# Patient Record
Sex: Female | Born: 1965 | Race: Asian | Hispanic: No | Marital: Married | Smoking: Never smoker
Health system: Southern US, Community
[De-identification: ages and names within clinical notes are randomized; demographics above are authoritative.]

## PROBLEM LIST (undated history)

## (undated) DIAGNOSIS — K219 Gastro-esophageal reflux disease without esophagitis: Secondary | ICD-10-CM

## (undated) DIAGNOSIS — N83209 Unspecified ovarian cyst, unspecified side: Secondary | ICD-10-CM

## (undated) HISTORY — DX: Gastro-esophageal reflux disease without esophagitis: K21.9

## (undated) HISTORY — PX: OTHER SURGICAL HISTORY: SHX169

---

## 1997-11-24 ENCOUNTER — Encounter: Admission: RE | Admit: 1997-11-24 | Discharge: 1997-11-24 | Payer: Self-pay | Admitting: Family Medicine

## 1997-12-01 ENCOUNTER — Encounter: Admission: RE | Admit: 1997-12-01 | Discharge: 1997-12-01 | Payer: Self-pay | Admitting: Family Medicine

## 1997-12-01 ENCOUNTER — Other Ambulatory Visit: Admission: RE | Admit: 1997-12-01 | Discharge: 1997-12-01 | Payer: Self-pay

## 1997-12-29 ENCOUNTER — Encounter: Admission: RE | Admit: 1997-12-29 | Discharge: 1997-12-29 | Payer: Self-pay | Admitting: Family Medicine

## 1998-02-01 ENCOUNTER — Encounter: Admission: RE | Admit: 1998-02-01 | Discharge: 1998-02-01 | Payer: Self-pay | Admitting: Family Medicine

## 1998-02-26 ENCOUNTER — Encounter: Payer: Self-pay | Admitting: *Deleted

## 1998-02-26 ENCOUNTER — Ambulatory Visit (HOSPITAL_COMMUNITY): Admission: RE | Admit: 1998-02-26 | Discharge: 1998-02-26 | Payer: Self-pay

## 1998-03-02 ENCOUNTER — Encounter: Admission: RE | Admit: 1998-03-02 | Discharge: 1998-03-02 | Payer: Self-pay | Admitting: Family Medicine

## 1998-04-05 ENCOUNTER — Encounter: Admission: RE | Admit: 1998-04-05 | Discharge: 1998-04-05 | Payer: Self-pay | Admitting: Sports Medicine

## 1998-04-27 ENCOUNTER — Encounter: Admission: RE | Admit: 1998-04-27 | Discharge: 1998-04-27 | Payer: Self-pay | Admitting: Sports Medicine

## 1998-04-30 ENCOUNTER — Ambulatory Visit (HOSPITAL_COMMUNITY): Admission: RE | Admit: 1998-04-30 | Discharge: 1998-04-30 | Payer: Self-pay | Admitting: *Deleted

## 1998-05-14 ENCOUNTER — Encounter: Admission: RE | Admit: 1998-05-14 | Discharge: 1998-05-14 | Payer: Self-pay | Admitting: Family Medicine

## 1998-05-31 ENCOUNTER — Encounter: Admission: RE | Admit: 1998-05-31 | Discharge: 1998-05-31 | Payer: Self-pay | Admitting: Family Medicine

## 1998-06-14 ENCOUNTER — Encounter: Admission: RE | Admit: 1998-06-14 | Discharge: 1998-06-14 | Payer: Self-pay | Admitting: Family Medicine

## 1998-06-23 ENCOUNTER — Encounter: Admission: RE | Admit: 1998-06-23 | Discharge: 1998-06-23 | Payer: Self-pay | Admitting: Family Medicine

## 1998-07-03 ENCOUNTER — Inpatient Hospital Stay (HOSPITAL_COMMUNITY): Admission: AD | Admit: 1998-07-03 | Discharge: 1998-07-05 | Payer: Self-pay | Admitting: Family Medicine

## 1998-07-13 ENCOUNTER — Encounter: Admission: RE | Admit: 1998-07-13 | Discharge: 1998-07-13 | Payer: Self-pay | Admitting: Sports Medicine

## 1998-07-20 ENCOUNTER — Encounter: Admission: RE | Admit: 1998-07-20 | Discharge: 1998-07-20 | Payer: Self-pay | Admitting: Family Medicine

## 1998-07-26 ENCOUNTER — Encounter: Admission: RE | Admit: 1998-07-26 | Discharge: 1998-07-26 | Payer: Self-pay | Admitting: Family Medicine

## 1998-07-28 ENCOUNTER — Encounter: Admission: RE | Admit: 1998-07-28 | Discharge: 1998-07-28 | Payer: Self-pay | Admitting: Family Medicine

## 1998-12-31 ENCOUNTER — Encounter: Admission: RE | Admit: 1998-12-31 | Discharge: 1998-12-31 | Payer: Self-pay | Admitting: Family Medicine

## 2003-05-28 ENCOUNTER — Encounter: Admission: RE | Admit: 2003-05-28 | Discharge: 2003-05-28 | Payer: Self-pay | Admitting: Internal Medicine

## 2003-07-12 ENCOUNTER — Inpatient Hospital Stay (HOSPITAL_COMMUNITY): Admission: AD | Admit: 2003-07-12 | Discharge: 2003-07-14 | Payer: Self-pay | Admitting: *Deleted

## 2005-07-14 ENCOUNTER — Emergency Department (HOSPITAL_COMMUNITY): Admission: EM | Admit: 2005-07-14 | Discharge: 2005-07-14 | Payer: Self-pay | Admitting: Family Medicine

## 2010-08-23 ENCOUNTER — Emergency Department (HOSPITAL_COMMUNITY): Payer: No Typology Code available for payment source

## 2010-08-23 ENCOUNTER — Emergency Department (HOSPITAL_COMMUNITY)
Admission: EM | Admit: 2010-08-23 | Discharge: 2010-08-23 | Disposition: A | Discharge status: Home / Self Care | Payer: No Typology Code available for payment source | Attending: Emergency Medicine | Admitting: Emergency Medicine

## 2010-08-23 DIAGNOSIS — IMO0002 Reserved for concepts with insufficient information to code with codable children: Secondary | ICD-10-CM | POA: Insufficient documentation

## 2010-08-23 DIAGNOSIS — R109 Unspecified abdominal pain: Secondary | ICD-10-CM | POA: Insufficient documentation

## 2010-08-23 DIAGNOSIS — M25559 Pain in unspecified hip: Secondary | ICD-10-CM | POA: Insufficient documentation

## 2010-08-23 DIAGNOSIS — M25519 Pain in unspecified shoulder: Secondary | ICD-10-CM | POA: Insufficient documentation

## 2010-08-23 DIAGNOSIS — T148XXA Other injury of unspecified body region, initial encounter: Secondary | ICD-10-CM | POA: Insufficient documentation

## 2010-08-23 DIAGNOSIS — M542 Cervicalgia: Secondary | ICD-10-CM | POA: Insufficient documentation

## 2010-08-23 DIAGNOSIS — R51 Headache: Secondary | ICD-10-CM | POA: Insufficient documentation

## 2011-06-08 ENCOUNTER — Emergency Department (INDEPENDENT_AMBULATORY_CARE_PROVIDER_SITE_OTHER)
Admission: EM | Admit: 2011-06-08 | Discharge: 2011-06-08 | Disposition: A | Discharge status: Home / Self Care | Payer: Self-pay | Source: Home / Self Care | Attending: Emergency Medicine | Admitting: Emergency Medicine

## 2011-06-08 ENCOUNTER — Encounter (HOSPITAL_COMMUNITY): Payer: Self-pay | Admitting: *Deleted

## 2011-06-08 DIAGNOSIS — R059 Cough, unspecified: Secondary | ICD-10-CM

## 2011-06-08 DIAGNOSIS — R05 Cough: Secondary | ICD-10-CM

## 2011-06-08 DIAGNOSIS — J069 Acute upper respiratory infection, unspecified: Secondary | ICD-10-CM

## 2011-06-08 MED ORDER — GUAIFENESIN-CODEINE 100-10 MG/5ML PO SYRP: 5.0000 mL | ORAL_SOLUTION | Freq: Three times a day (TID) | ORAL | Status: AC | PRN

## 2011-06-08 NOTE — ED Notes (Signed)
Pt  Has  Symptoms  Of  Body  Aches   Cough   Congested  And  sorethroat  X   3  Days     Pt  Is  Sitting  Upright on  Exam table  Speaking in  Complete  sentances

## 2011-06-08 NOTE — ED Provider Notes (Signed)
History     CSN: [id number]  Arrival date & time 06/08/11  0841   First MD Initiated Contact with Patient 06/08/11 [phone]      Chief Complaint  Patient presents with  . Cough     HPI Comments: Patient began having a cough, sore throat and soreness in her arms 3 days ago. Symptoms have been stable and improved with motrin. Yesterday she had a coughing episode followed by burning in the throat and accompanied with some nausea. She has also taken some tea for symptoms. Her two daughters were treated for similar symptoms accompanied by fever last week with cough medicine, and they are improving. Patient states she had a flu shot in November 2012.   Patient denies dyspnea, wheezing, chest pain, emesis, abdominal pain, fevers.  No history of smoking.  Patient is a 46 y.o. female presenting with cough. The history is provided by the patient and the spouse.  Cough This is a new problem. Pertinent negatives include no ear pain, no headaches, no rhinorrhea and no wheezing.    History reviewed. No pertinent past medical history.  History reviewed. No pertinent past surgical history.  No family history on file.  History  Substance Use Topics  . Smoking status: Not on file  . Smokeless tobacco: Not on file  . Alcohol Use: Not on file    OB History    Grav Para Term Preterm Abortions TAB SAB Ect Mult Living                  Review of Systems  Constitutional: Negative for fever.  HENT: Negative for ear pain, facial swelling, rhinorrhea, neck pain and neck stiffness.   Respiratory: Positive for cough. Negative for wheezing.   Musculoskeletal: Negative for back pain.  Neurological: Negative for dizziness and headaches.    Allergies  Review of patient's allergies indicates not on file.  Home Medications   Current Outpatient Rx  Name Route Sig Dispense Refill  . IBUPROFEN 200 MG PO TABS Oral Take 200 mg by mouth every 6 (six) hours as needed.      BP 121/82  Pulse 70   Temp(Src) 98.1 F (36.7 C) (Oral)  Resp 16  SpO2 96%  LMP 05/25/2011  Physical Exam  Vitals reviewed. Constitutional: She is oriented to person, place, and time. She appears well-developed and well-nourished. No distress.       Appears fatigued.  HENT:  Head: Normocephalic.  Right Ear: External ear normal.  Left Ear: External ear normal.  Nose: Nose normal.       Pharynx mildly injected. No exudate.   Eyes: EOM are normal. Pupils are equal, round, and reactive to light.       Sclera injected.   Neck: Normal range of motion. Neck supple.  Cardiovascular: Normal rate, regular rhythm and normal heart sounds.   No murmur heard. Pulmonary/Chest: Effort normal and breath sounds normal. No respiratory distress. She has no wheezes. She has no rales. She exhibits no tenderness.  Abdominal: Soft. Bowel sounds are normal.  Musculoskeletal: She exhibits no edema.  Lymphadenopathy:    She has no cervical adenopathy.  Neurological: She is alert and oriented to person, place, and time. Coordination normal.  Skin: No rash noted.    ED Course  Procedures (including critical care time)  Labs Reviewed - No data to display No results found.   No diagnosis found.    MDM   Viral URI. Cough and sore throat predominant symptoms. Treat symptomatically motrin  prn and tessalon. Discussed red flags to seek return to care: fever, dyspnea, worsening or fails to improve in several days. Patient agrees to plan.       Jimmie Molly, MD 06/08/11 [phone]

## 2011-06-08 NOTE — Discharge Instructions (Signed)
  As discussed if any changes such as new fevers, shortness of breath or chest pains her symptoms beyond 7 days return for recheck in the meantime take his prescribed medicine for your cough Cough, Adult  A cough is a reflex. It helps you clear your throat and airways. A cough can help heal your body. A cough can last 2 or 3 weeks (acute) or may last more than 8 weeks (chronic). Some common causes of a cough can include an infection, allergy, or a cold. HOME CARE  Only take medicine as told by your doctor.   If given, take your medicines (antibiotics) as told. Finish them even if you start to feel better.   Use a cold steam vaporizer or humidier in your home. This can help loosen thick spit (secretions).   Sleep so you are almost sitting up (semi-upright). Use pillows to do this. This helps reduce coughing.   Rest as needed.   Stop smoking if you smoke.  GET HELP RIGHT AWAY IF:  You have yellowish-white fluid (pus) in your thick spit.   Your cough gets worse.   Your medicine does not reduce coughing, and you are losing sleep.   You cough up blood.   You have trouble breathing.   Your pain gets worse and medicine does not help.   You have a fever.  MAKE SURE YOU:   Understand these instructions.   Will watch your condition.   Will get help right away if you are not doing well or get worse.  Document Released: 10/20/2010 Document Revised: 01/26/2011 Document Reviewed: 10/20/2010 [facility] Patient Information 2012 [place], [place].

## 2011-06-29 ENCOUNTER — Emergency Department (INDEPENDENT_AMBULATORY_CARE_PROVIDER_SITE_OTHER)
Admission: EM | Admit: 2011-06-29 | Discharge: 2011-06-29 | Disposition: A | Discharge status: Home / Self Care | Payer: Self-pay | Source: Home / Self Care | Attending: Family Medicine | Admitting: Family Medicine

## 2011-06-29 ENCOUNTER — Encounter (HOSPITAL_COMMUNITY): Payer: Self-pay

## 2011-06-29 DIAGNOSIS — J069 Acute upper respiratory infection, unspecified: Secondary | ICD-10-CM

## 2011-06-29 DIAGNOSIS — K219 Gastro-esophageal reflux disease without esophagitis: Secondary | ICD-10-CM

## 2011-06-29 MED ORDER — AMOXICILLIN 500 MG PO CAPS: 500.0000 mg | ORAL_CAPSULE | Freq: Three times a day (TID) | ORAL | Status: AC

## 2011-06-29 MED ORDER — PROMETHAZINE-CODEINE 6.25-10 MG/5ML PO SYRP: 5.0000 mL | ORAL_SOLUTION | Freq: Four times a day (QID) | ORAL | Status: AC | PRN

## 2011-06-29 MED ORDER — OMEPRAZOLE 20 MG PO CPDR: 20.0000 mg | DELAYED_RELEASE_CAPSULE | Freq: Every day | ORAL | Status: DC

## 2011-06-29 NOTE — Discharge Instructions (Signed)
Stop advil. Avoid caffeine and milk products. No spicy foods. If symptoms persist recommend GI eval. Do not ear 4 hrs prior to going to bed.

## 2011-06-29 NOTE — ED Notes (Signed)
C/o sore throat, cough, burning in epigastric and esophagus when lying down at night, states it feels like her food doesn't want to go down at times.  States sx for 1 month- came here before for the same.

## 2011-06-29 NOTE — ED Provider Notes (Signed)
History     CSN: [id number]  Arrival date & time 06/29/11  0846   First MD Initiated Contact with Patient 06/29/11 [phone]      Chief Complaint  Patient presents with  . Cough  . Sore Throat  . Heartburn    (Consider location/radiation/quality/duration/timing/severity/associated sxs/prior treatment) HPI Comments: The patient reports having a sore throat and cough . Onset 1 month ago. No fever. At night when she lays down she gets a burning in her throat. Takes a lot of advil and drinks tea. No fever. No runny nose. Cough is dry.   The history is provided by the patient and the spouse.    History reviewed. No pertinent past medical history.  History reviewed. No pertinent past surgical history.  History reviewed. No pertinent family history.  History  Substance Use Topics  . Smoking status: Not on file  . Smokeless tobacco: Not on file  . Alcohol Use: No    OB History    Grav Para Term Preterm Abortions TAB SAB Ect Mult Living                  Review of Systems  Constitutional: Negative.   HENT: Positive for sore throat and trouble swallowing. Negative for ear pain, rhinorrhea and postnasal drip.   Respiratory: Positive for cough.   Cardiovascular: Negative.   Gastrointestinal: Negative for nausea, vomiting, diarrhea, constipation and abdominal distention.  Genitourinary: Negative.   Skin: Negative.     Allergies  Review of patient's allergies indicates not on file.  Home Medications   Current Outpatient Rx  Name Route Sig Dispense Refill  . AMOXICILLIN 500 MG PO CAPS Oral Take 1 capsule (500 mg total) by mouth 3 (three) times daily. 30 capsule 0  . IBUPROFEN 200 MG PO TABS Oral Take 200 mg by mouth every 6 (six) hours as needed.    Marland Kitchen OMEPRAZOLE 20 MG PO CPDR Oral Take 1 capsule (20 mg total) by mouth daily. 30 capsule 1  . PROMETHAZINE-CODEINE 6.25-10 MG/5ML PO SYRP Oral Take 5 mLs by mouth every 6 (six) hours as needed for cough. 120 mL 0    BP 122/70   Pulse 74  Temp(Src) 98.3 F (36.8 C) (Oral)  Resp 20  SpO2 100%  LMP 06/26/2011  Physical Exam  Nursing note and vitals reviewed. Constitutional: She appears well-developed and well-nourished. No distress.  HENT:       Ears clear, nose clear, throat mild erythema.   Neck: Normal range of motion. Neck supple. No thyromegaly present.  Cardiovascular: Normal rate, regular rhythm and normal heart sounds.   Pulmonary/Chest: Effort normal and breath sounds normal.  Abdominal: Soft. She exhibits no distension. There is no tenderness. There is no rebound.  Lymphadenopathy:    She has no cervical adenopathy.  Skin: Skin is warm and dry.    ED Course  Procedures (including critical care time)  Labs Reviewed - No data to display No results found.   1. URI (upper respiratory infection)   2. Acid reflux       MDM          Randa Spike, MD 06/29/11 1016

## 2012-07-09 ENCOUNTER — Ambulatory Visit: Payer: BC Managed Care – PPO

## 2012-07-09 ENCOUNTER — Ambulatory Visit (INDEPENDENT_AMBULATORY_CARE_PROVIDER_SITE_OTHER): Payer: BC Managed Care – PPO | Admitting: Family Medicine

## 2012-07-09 VITALS — BP 110/76 | HR 64 | Temp 97.9°F | Resp 18 | Ht 60.0 in | Wt 124.0 lb

## 2012-07-09 DIAGNOSIS — R109 Unspecified abdominal pain: Secondary | ICD-10-CM

## 2012-07-09 DIAGNOSIS — K219 Gastro-esophageal reflux disease without esophagitis: Secondary | ICD-10-CM

## 2012-07-09 DIAGNOSIS — Z609 Problem related to social environment, unspecified: Secondary | ICD-10-CM

## 2012-07-09 DIAGNOSIS — Z789 Other specified health status: Secondary | ICD-10-CM

## 2012-07-09 DIAGNOSIS — R1110 Vomiting, unspecified: Secondary | ICD-10-CM

## 2012-07-09 DIAGNOSIS — K5900 Constipation, unspecified: Secondary | ICD-10-CM

## 2012-07-09 DIAGNOSIS — R51 Headache: Secondary | ICD-10-CM

## 2012-07-09 LAB — COMPREHENSIVE METABOLIC PANEL
Albumin: 4.6 g/dL (ref 3.5–5.2)
BUN: 9 mg/dL (ref 6–23)
CO2: 27 mEq/L (ref 19–32)
Calcium: 9.1 mg/dL (ref 8.4–10.5)
Chloride: 103 mEq/L (ref 96–112)
Creat: 0.63 mg/dL (ref 0.50–1.10)
Glucose, Bld: 93 mg/dL (ref 70–99)
Potassium: 4 mEq/L (ref 3.5–5.3)

## 2012-07-09 LAB — POCT CBC
HCT, POC: 39.6 % (ref 37.7–47.9)
MCH, POC: 26.6 pg — AB (ref 27–31.2)
MCV: 85.8 fL (ref 80–97)
MID (cbc): 0.3 (ref 0–0.9)
Platelet Count, POC: 215 10*3/uL (ref 142–424)
RBC: 4.62 M/uL (ref 4.04–5.48)
WBC: 4.9 10*3/uL (ref 4.6–10.2)

## 2012-07-09 LAB — POCT URINALYSIS DIPSTICK
Bilirubin, UA: NEGATIVE
Glucose, UA: NEGATIVE
Ketones, UA: NEGATIVE
Protein, UA: NEGATIVE
Spec Grav, UA: 1.015

## 2012-07-09 LAB — POCT UA - MICROSCOPIC ONLY: Mucus, UA: NEGATIVE

## 2012-07-09 NOTE — Progress Notes (Signed)
Subjective:    Patient ID: Megan Ford, female    DOB: 1965-12-03, 47 y.o.   MRN: [id number]  HPI Megan Ford is a 47 y.o. female Here with sister - translating due to language barrier.  Started with nausea, vomiting for past 3 days. Fever 2 days ago - subjective. Lower abd pain.   No known sick contacts. No diarrhea. Last BM - 2 days ago, hard stool - has been constipated. Unable to pass gas, a lot of belching. Hx of reflux - taken omeprazole prior - none in past month - has had heartburn - more at night, but medicine expensive.  Unable to keep fluids or solids down. Last uop this am at 7:00. 3- 4 uop per day.  Has been trying to drink fluids. Emesis with every attempt at eating - twice yesterday.  None yet today, but has not eaten.   LMP - 5 days ago - painful menses, but otherwise normal.   Review of Systems  Constitutional: Positive for fever and appetite change.  Gastrointestinal: Positive for nausea, vomiting, abdominal pain and constipation. Negative for abdominal distention and anal bleeding.  Genitourinary: Negative for dysuria, urgency, frequency, hematuria and difficulty urinating.       Objective:   Physical Exam  Vitals reviewed. Constitutional: She is oriented to person, place, and time. She appears well-developed and well-nourished.  HENT:  Head: Normocephalic and atraumatic.  Mouth/Throat: Oropharynx is clear and moist. No oropharyngeal exudate.  Neck: Neck supple.  Cardiovascular: Normal rate, regular rhythm, normal heart sounds and intact distal pulses.   No murmur heard. Pulmonary/Chest: Effort normal and breath sounds normal.  Abdominal: Soft. Normal appearance. She exhibits no distension. There is no hepatosplenomegaly. There is tenderness in the epigastric area and suprapubic area. There is no rebound, no guarding, no CVA tenderness, no tenderness at McBurney's point and negative Murphy's sign.  Neurological: She is alert and oriented to person, place, and  time.  Skin: Skin is warm and dry. No rash noted.  Psychiatric: She has a normal mood and affect. Her behavior is normal.   [facility] reading (PRIMARY) by  Dr. Neva Seat: acute abd series: increased stool on right, nonspecific bg findings otherwise.   Results for orders placed in visit on 07/09/12  POCT CBC      Result Value Range   WBC 4.9  4.6 - 10.2 K/uL   Lymph, poc 1.8  0.6 - 3.4   POC LYMPH PERCENT 37.4  10 - 50 %L   MID (cbc) 0.3  0 - 0.9   POC MID % 6.0  0 - 12 %M   POC Granulocyte 2.8  2 - 6.9   Granulocyte percent 56.6  37 - 80 %G   RBC 4.62  4.04 - 5.48 M/uL   Hemoglobin 12.3  12.2 - 16.2 g/dL   HCT, POC 40.9  81.1 - 47.9 %   MCV 85.8  80 - 97 fL   MCH, POC 26.6 (*) 27 - 31.2 pg   MCHC 31.1 (*) 31.8 - 35.4 g/dL   RDW, POC 91.4     Platelet Count, POC 215  142 - 424 K/uL   MPV 8.9  0 - 99.8 fL  POCT URINE PREGNANCY      Result Value Range   Preg Test, Ur Negative    POCT URINALYSIS DIPSTICK      Result Value Range   Color, UA yellow     Clarity, UA clear     Glucose, UA neg  Bilirubin, UA neg     Ketones, UA neg     Spec Grav, UA 1.015     Blood, UA trace-intact     pH, UA 6.0     Protein, UA neg     Urobilinogen, UA 2.0     Nitrite, UA neg     Leukocytes, UA Trace    POCT UA - MICROSCOPIC ONLY      Result Value Range   WBC, Ur, HPF, POC 0-1     RBC, urine, microscopic 0-1     Bacteria, U Microscopic trace     Mucus, UA neg     Epithelial cells, urine per micros 0-2     Crystals, Ur, HPF, POC neg     Casts, Ur, LPF, POC neg     Yeast, UA neg         Assessment & Plan:  Megan Ford is a 47 y.o. female  Emesis - Plan: POCT CBC, POCT urine pregnancy, POCT urinalysis dipstick, POCT UA - Microscopic Only, DG Abd Acute W/Chest  GERD (gastroesophageal reflux disease)  Lower abdominal pain, unspecified laterality - Plan: POCT urine pregnancy, POCT urinalysis dipstick, POCT UA - Microscopic Only, Lipase, Comprehensive metabolic panel, DG Abd Acute  W/Chest  Unspecified constipation - Plan: DG Abd Acute W/Chest  Language barrier    3 day hx of postprandial emesis, underlying GERD. Subjective fever 2 days ago, but normal now, and reassuring CBC, and U/a (trace blood, but just stopped menses few days ago, and no urinary sx's).   ddx viral infection, but suspect component of GERD, and less likely achalasia. Minimal lower abd ttp. Can restart omeprazole Rx or if needed for cost - prilosec otc, check lipase, cmp, maintain fluid intake and soft foods as tolerated, and recheck in next 48 hours.  Will hold on zofran as already with constipation.  Can try otc colace or miralax for constipation.  If not improving in next 2 days - may need GI eval.  RTC or to ER sooner if any worsening. Language barrier - but discussed with dtr translating and understanding expressed.   Patient Instructions  Can restart omeprazole prescription, or over the counter Prilosec OTC - one per day. Colace or Miralax over the counter for constipation if needed. Continue to drink fluids, and soft foods if keeping fluids down.  No new medicines for nausea right now as they may worsen your constipation. Recheck in next 2 days, will have lab results back then. Return to the clinic or go to the nearest emergency room if any of your symptoms worsen or new symptoms occur. May need to see a stomach specialist if not improving at follow up in 2 days (sooner if any worsening).

## 2012-07-09 NOTE — Patient Instructions (Signed)
Can restart omeprazole prescription, or over the counter Prilosec OTC - one per day. Colace or Miralax over the counter for constipation if needed. Continue to drink fluids, and soft foods if keeping fluids down.  No new medicines for nausea right now as they may worsen your constipation. Recheck in next 2 days, will have lab results back then. Return to the clinic or go to the nearest emergency room if any of your symptoms worsen or new symptoms occur. May need to see a stomach specialist if not improving at follow up in 2 days (sooner if any worsening).

## 2012-07-11 ENCOUNTER — Ambulatory Visit (INDEPENDENT_AMBULATORY_CARE_PROVIDER_SITE_OTHER): Payer: BC Managed Care – PPO | Admitting: Family Medicine

## 2012-07-11 VITALS — BP 110/58 | HR 76 | Temp 98.2°F | Resp 16 | Ht 60.0 in | Wt 128.0 lb

## 2012-07-11 DIAGNOSIS — R8281 Pyuria: Secondary | ICD-10-CM

## 2012-07-11 DIAGNOSIS — R82998 Other abnormal findings in urine: Secondary | ICD-10-CM

## 2012-07-11 DIAGNOSIS — R109 Unspecified abdominal pain: Secondary | ICD-10-CM

## 2012-07-11 DIAGNOSIS — R1030 Lower abdominal pain, unspecified: Secondary | ICD-10-CM

## 2012-07-11 LAB — POCT URINALYSIS DIPSTICK
Bilirubin, UA: NEGATIVE
Nitrite, UA: NEGATIVE
Protein, UA: NEGATIVE
pH, UA: 6.5

## 2012-07-11 LAB — POCT UA - MICROSCOPIC ONLY
Casts, Ur, LPF, POC: NEGATIVE
Yeast, UA: NEGATIVE

## 2012-07-11 LAB — POCT CBC
Granulocyte percent: 58.2 %G (ref 37–80)
HCT, POC: 32.8 % — AB (ref 37.7–47.9)
MCH, POC: 27 pg (ref 27–31.2)
MCV: 85.2 fL (ref 80–97)
MID (cbc): 0.3 (ref 0–0.9)
MPV: 8.3 fL (ref 0–99.8)
POC LYMPH PERCENT: 36 %L (ref 10–50)
RBC: 3.85 M/uL — AB (ref 4.04–5.48)
WBC: 5.6 10*3/uL (ref 4.6–10.2)

## 2012-07-11 MED ORDER — NITROFURANTOIN MONOHYD MACRO 100 MG PO CAPS: 100.0000 mg | ORAL_CAPSULE | Freq: Two times a day (BID) | ORAL | Status: DC

## 2012-07-11 NOTE — Progress Notes (Signed)
Subjective:    Patient ID: Megan Ford, female    DOB: June 11, 1965, 47 y.o.   MRN: [id number]  HPI Megan Ford is a 47 y.o. female  See ov 2 days ago -  3 day hx of postprandial emesis, underlying GERD. Subjective fever 2 days prior, but normal  In office. reassuring CBC and U/a, but suspect component of GERD, and less likely achalasia. Minimal lower abd ttp.  Trial of prilosec, then colace or miralax for constipation. Here for follow up.  Labs from last ov: Results for orders placed in visit on 07/09/12  LIPASE      Result Value Range   Lipase 26  0 - 75 U/L  COMPREHENSIVE METABOLIC PANEL      Result Value Range   Sodium 140  135 - 145 mEq/L   Potassium 4.0  3.5 - 5.3 mEq/L   Chloride 103  96 - 112 mEq/L   CO2 27  19 - 32 mEq/L   Glucose, Bld 93  70 - 99 mg/dL   BUN 9  6 - 23 mg/dL   Creat 4.09  8.11 - 9.14 mg/dL   Total Bilirubin 0.7  0.3 - 1.2 mg/dL   Alkaline Phosphatase 81  39 - 117 U/L   AST 33  0 - 37 U/L   ALT 17  0 - 35 U/L   Total Protein 7.0  6.0 - 8.3 g/dL   Albumin 4.6  3.5 - 5.2 g/dL   Calcium 9.1  8.4 - 78.2 mg/dL  POCT CBC      Result Value Range   WBC 4.9  4.6 - 10.2 K/uL   Lymph, poc 1.8  0.6 - 3.4   POC LYMPH PERCENT 37.4  10 - 50 %L   MID (cbc) 0.3  0 - 0.9   POC MID % 6.0  0 - 12 %M   POC Granulocyte 2.8  2 - 6.9   Granulocyte percent 56.6  37 - 80 %G   RBC 4.62  4.04 - 5.48 M/uL   Hemoglobin 12.3  12.2 - 16.2 g/dL   HCT, POC 95.6  21.3 - 47.9 %   MCV 85.8  80 - 97 fL   MCH, POC 26.6 (*) 27 - 31.2 pg   MCHC 31.1 (*) 31.8 - 35.4 g/dL   RDW, POC 08.6     Platelet Count, POC 215  142 - 424 K/uL   MPV 8.9  0 - 99.8 fL  POCT URINE PREGNANCY      Result Value Range   Preg Test, Ur Negative    POCT URINALYSIS DIPSTICK      Result Value Range   Color, UA yellow     Clarity, UA clear     Glucose, UA neg     Bilirubin, UA neg     Ketones, UA neg     Spec Grav, UA 1.015     Blood, UA trace-intact     pH, UA 6.0     Protein, UA neg     Urobilinogen, UA 2.0     Nitrite, UA neg     Leukocytes, UA Trace    POCT UA - MICROSCOPIC ONLY      Result Value Range   WBC, Ur, HPF, POC 0-1     RBC, urine, microscopic 0-1     Bacteria, U Microscopic trace     Mucus, UA neg     Epithelial cells, urine per micros 0-2     Crystals, Ur, HPF,  POC neg     Casts, Ur, LPF, POC neg     Yeast, UA neg     Radiology report: ACUTE ABDOMEN SERIES (ABDOMEN 2 VIEW & CHEST 1 VIEW)  Comparison: None.  Findings: Normal mediastinum and cardiac silhouette. Normal  pulmonary vasculature. No evidence of effusion, infiltrate, or  pneumothorax. No acute bony abnormality.  There are no dilated loops of large or small bowel. There is  moderate volume stool in the ascending colon and transverse colon.  There is gas in the rectum. No pathologic calcifications.  IMPRESSION:  1. No acute cardiopulmonary process.  2. No bowel obstruction or intraperitoneal free air.  3. Moderate volume stool in the ascending colon and transverse  colon.  Here with dtr - translating, due to language barrier, but difficulty history. Feeling a little bit better - no further vomiting. Still with some lower abdominal soreness. Sore to carry heavy things. Normal stool this am. Prior stool 2 days ago. Did not take any colace or miralax. No fever. Sometimes belching - less than last ov. Eating and drinking ok.  Review of Systems  Constitutional: Negative for fever and chills.  Gastrointestinal: Positive for abdominal pain. Negative for nausea, vomiting and diarrhea.  Genitourinary: Negative for dysuria, urgency, frequency, vaginal discharge and difficulty urinating.       Objective:   Physical Exam  Vitals reviewed. Constitutional: She is oriented to person, place, and time. She appears well-developed and well-nourished. No distress.  HENT:  Head: Normocephalic and atraumatic.  Pulmonary/Chest: Effort normal.  Abdominal: Soft. Bowel sounds are normal. She exhibits no  distension. There is tenderness (mild suprapubic/llq/rlq. ) in the right lower quadrant, suprapubic area and left lower quadrant. There is no rigidity, no rebound and no guarding. No hernia (no apparent defect noted with sitting up. ). Hernia confirmed negative in the ventral area.  Neurological: She is alert and oriented to person, place, and time.   Results for orders placed in visit on 07/11/12  POCT CBC      Result Value Range   WBC 5.6  4.6 - 10.2 K/uL   Lymph, poc 2.0  0.6 - 3.4   POC LYMPH PERCENT 36.0  10 - 50 %L   MID (cbc) 0.3  0 - 0.9   POC MID % 5.8  0 - 12 %M   POC Granulocyte 3.3  2 - 6.9   Granulocyte percent 58.2  37 - 80 %G   RBC 3.85 (*) 4.04 - 5.48 M/uL   Hemoglobin 10.4 (*) 12.2 - 16.2 g/dL   HCT, POC 16.1 (*) 09.6 - 47.9 %   MCV 85.2  80 - 97 fL   MCH, POC 27.0  27 - 31.2 pg   MCHC 31.7 (*) 31.8 - 35.4 g/dL   RDW, POC 04.5     Platelet Count, POC 200  142 - 424 K/uL   MPV 8.3  0 - 99.8 fL  POCT URINALYSIS DIPSTICK      Result Value Range   Color, UA yellow     Clarity, UA clear     Glucose, UA neg     Bilirubin, UA neg     Ketones, UA neg     Spec Grav, UA 1.010     Blood, UA neg     pH, UA 6.5     Protein, UA neg     Urobilinogen, UA 0.2     Nitrite, UA neg     Leukocytes, UA small (1+)  POCT UA - MICROSCOPIC ONLY      Result Value Range   WBC, Ur, HPF, POC 2-4     RBC, urine, microscopic 0-1     Bacteria, U Microscopic trace     Mucus, UA neg     Epithelial cells, urine per micros 1-2     Crystals, Ur, HPF, POC neg     Casts, Ur, LPF, POC neg     Yeast, UA neg         Assessment & Plan:  Megan Ford is a 47 y.o. female Abdominal  pain, other specified site - Plan: POCT CBC, POCT urinalysis dipstick, POCT UA - Microscopic Only, Urine culture, nitrofurantoin, macrocrystal-monohydrate, (MACROBID) 100 MG capsule  Suprapubic abdominal pain, unspecified laterality - Plan: Urine culture, nitrofurantoin, macrocrystal-monohydrate, (MACROBID) 100  MG capsule  Pyuria - Plan: Urine culture, nitrofurantoin, macrocrystal-monohydrate, (MACROBID) 100 MG capsule   Initial presentation 2 days ago with postprandial emesis, underlying GERD. No further emesis, improved some on prilosec, but still with lower abd pain.  Few wbc on U/a tonight - will start macrobid, check urine cx, start colace for possible constipation contributor to pain. Recheck in 2 days, sooner if any worsening. possible CT abdomen if needed. Understanding expressed with dtr translating.   Meds ordered this encounter  Medications  . nitrofurantoin, macrocrystal-monohydrate, (MACROBID) 100 MG capsule    Sig: Take 1 capsule (100 mg total) by mouth 2 (two) times daily.    Dispense:  14 capsule    Refill:  0     Patient Instructions  Your blood count is again reassuring. Some of the abdominal soreness may be related to constipation - start colace  (over the counter) twice each day until bowel movements are soft. There were a few infection fighting cells in the urine today - will check another urine test for infection, but start antibiotic.  recheck in 2 days - if still having pain then - may need to have cat scan of abdomen, Return to the clinic or go to the nearest emergency room if any of your symptoms worsen or new symptoms occur.

## 2012-07-11 NOTE — Patient Instructions (Addendum)
Your blood count is again reassuring. Some of the abdominal soreness may be related to constipation - start colace  (over the counter) twice each day until bowel movements are soft. There were a few infection fighting cells in the urine today - will check another urine test for infection, but start antibiotic.  recheck in 2 days - if still having pain then - may need to have cat scan of abdomen, Return to the clinic or go to the nearest emergency room if any of your symptoms worsen or new symptoms occur.

## 2012-07-13 LAB — URINE CULTURE: Colony Count: 15000

## 2012-07-20 ENCOUNTER — Encounter: Payer: Self-pay | Admitting: Radiology

## 2012-08-17 ENCOUNTER — Other Ambulatory Visit: Payer: Self-pay | Admitting: Family Medicine

## 2012-08-17 ENCOUNTER — Ambulatory Visit (INDEPENDENT_AMBULATORY_CARE_PROVIDER_SITE_OTHER): Payer: BC Managed Care – PPO | Admitting: Family Medicine

## 2012-08-17 ENCOUNTER — Ambulatory Visit: Payer: BC Managed Care – PPO

## 2012-08-17 VITALS — BP 127/81 | HR 71 | Temp 98.1°F | Resp 16 | Ht 60.0 in | Wt 122.2 lb

## 2012-08-17 DIAGNOSIS — R109 Unspecified abdominal pain: Secondary | ICD-10-CM

## 2012-08-17 DIAGNOSIS — N949 Unspecified condition associated with female genital organs and menstrual cycle: Secondary | ICD-10-CM

## 2012-08-17 DIAGNOSIS — R059 Cough, unspecified: Secondary | ICD-10-CM

## 2012-08-17 DIAGNOSIS — R05 Cough: Secondary | ICD-10-CM

## 2012-08-17 DIAGNOSIS — R102 Pelvic and perineal pain: Secondary | ICD-10-CM

## 2012-08-17 LAB — POCT CBC
Granulocyte percent: 69.5 %G (ref 37–80)
HCT, POC: 39.5 % (ref 37.7–47.9)
Hemoglobin: 12.5 g/dL (ref 12.2–16.2)
Lymph, poc: 1.6 (ref 0.6–3.4)
MCH, POC: 26.9 pg — AB (ref 27–31.2)
MCHC: 31.6 g/dL — AB (ref 31.8–35.4)
MCV: 84.9 fL (ref 80–97)
MID (cbc): 0.3 (ref 0–0.9)
MPV: 9.5 fL (ref 0–99.8)
POC Granulocyte: 4.5 (ref 2–6.9)
POC LYMPH PERCENT: 25.2 %L (ref 10–50)
POC MID %: 5.3 %M (ref 0–12)
Platelet Count, POC: 189 10*3/uL (ref 142–424)
RBC: 4.65 M/uL (ref 4.04–5.48)
RDW, POC: 14 %
WBC: 6.5 10*3/uL (ref 4.6–10.2)

## 2012-08-17 LAB — POCT WET PREP WITH KOH
KOH Prep POC: NEGATIVE
Trichomonas, UA: NEGATIVE
Yeast Wet Prep HPF POC: NEGATIVE

## 2012-08-17 LAB — POCT URINALYSIS DIPSTICK
Blood, UA: NEGATIVE
Protein, UA: NEGATIVE
Spec Grav, UA: 1.01
Urobilinogen, UA: 0.2
pH, UA: 7

## 2012-08-17 LAB — COMPREHENSIVE METABOLIC PANEL
ALT: <8 U/L (ref 0–35)
AST: 13 U/L (ref 0–37)
Albumin: 4.6 g/dL (ref 3.5–5.2)
Alkaline Phosphatase: 46 U/L (ref 39–117)
BUN: 9 mg/dL (ref 6–23)
CO2: 27 mEq/L (ref 19–32)
Calcium: 9.3 mg/dL (ref 8.4–10.5)
Chloride: 104 mEq/L (ref 96–112)
Creat: 0.81 mg/dL (ref 0.50–1.10)
Glucose, Bld: 99 mg/dL (ref 70–99)
Potassium: 4.4 mEq/L (ref 3.5–5.3)
Sodium: 138 mEq/L (ref 135–145)
Total Bilirubin: 0.7 mg/dL (ref 0.3–1.2)
Total Protein: 7.2 g/dL (ref 6.0–8.3)

## 2012-08-17 LAB — POCT UA - MICROSCOPIC ONLY
Crystals, Ur, HPF, POC: NEGATIVE
Yeast, UA: NEGATIVE

## 2012-08-17 MED ORDER — CEFTRIAXONE SODIUM 1 G IJ SOLR
1.0000 g | Freq: Once | INTRAMUSCULAR | Status: AC
  Administered 2012-08-17: 1 g via INTRAMUSCULAR

## 2012-08-17 MED ORDER — DOXYCYCLINE HYCLATE 100 MG PO TABS: 100.0000 mg | ORAL_TABLET | Freq: Two times a day (BID) | ORAL | Status: DC

## 2012-08-17 NOTE — Progress Notes (Signed)
47 yo [place] woman with lower abdominal pain x 2 days, worse over night.  Voiding more than usual as well.  No CVAT.  No vomiting. She has been coughing more than usual. LMP:  June 10th  Objective:  Appears apprehensive,  We have a language barrier. Abdomen: soft, very tender hypogastrium.  Some rebound in suprapubic area. Chest:  Clear,  Heart:  Regular, no murmur No edema, good DP's Skin warm and dry HEENT:  Unremarkable.  Results for orders placed in visit on 08/17/12  POCT URINALYSIS DIPSTICK      Result Value Range   Color, UA yellow     Clarity, UA clear     Glucose, UA neg     Bilirubin, UA neg     Ketones, UA neg     Spec Grav, UA 1.010     Blood, UA neg     pH, UA 7.0     Protein, UA neg     Urobilinogen, UA 0.2     Nitrite, UA neg     Leukocytes, UA Negative    POCT UA - MICROSCOPIC ONLY      Result Value Range   WBC, Ur, HPF, POC 1-2     RBC, urine, microscopic 0-1     Bacteria, U Microscopic trace     Mucus, UA neg     Epithelial cells, urine per micros 1-2     Crystals, Ur, HPF, POC neg     Casts, Ur, LPF, POC neg     Yeast, UA neg    POCT CBC      Result Value Range   WBC 6.5  4.6 - 10.2 K/uL   Lymph, poc 1.6  0.6 - 3.4   POC LYMPH PERCENT 25.2  10 - 50 %L   MID (cbc) 0.3  0 - 0.9   POC MID % 5.3  0 - 12 %M   POC Granulocyte 4.5  2 - 6.9   Granulocyte percent 69.5  37 - 80 %G   RBC 4.65  4.04 - 5.48 M/uL   Hemoglobin 12.5  12.2 - 16.2 g/dL   HCT, POC 13.2  44.0 - 47.9 %   MCV 84.9  80 - 97 fL   MCH, POC 26.9 (*) 27 - 31.2 pg   MCHC 31.6 (*) 31.8 - 35.4 g/dL   RDW, POC 10.2     Platelet Count, POC 189  142 - 424 K/uL   MPV 9.5  0 - 99.8 fL   Pelvic exam:  Purulent discharge from the os with cervical motion tenderness.   Bimanual reveals 4 wk sized, irreg shaped, tender utx with no adnexal mass [facility] reading (PRIMARY) by  Dr. Milus Glazier: 3 way of abdomen unremarkable.   Assessment: Exam is most consistent with PID. Cultures are  pending.  Plan: Await cultures, Rocephin 1 g, doxycycline 100 twice a day x10 days, recheck 48 hours Signed, Elvina Sidle

## 2012-08-18 LAB — GC/CHLAMYDIA PROBE AMP
CT Probe RNA: NEGATIVE
GC Probe RNA: NEGATIVE

## 2012-08-20 ENCOUNTER — Emergency Department (HOSPITAL_COMMUNITY): Payer: BC Managed Care – PPO

## 2012-08-20 ENCOUNTER — Emergency Department (HOSPITAL_COMMUNITY)
Admission: EM | Admit: 2012-08-20 | Discharge: 2012-08-20 | Disposition: A | Discharge status: Home / Self Care | Payer: BC Managed Care – PPO | Attending: Emergency Medicine | Admitting: Emergency Medicine

## 2012-08-20 ENCOUNTER — Encounter (HOSPITAL_COMMUNITY): Payer: Self-pay

## 2012-08-20 DIAGNOSIS — N83209 Unspecified ovarian cyst, unspecified side: Secondary | ICD-10-CM | POA: Insufficient documentation

## 2012-08-20 DIAGNOSIS — Z3202 Encounter for pregnancy test, result negative: Secondary | ICD-10-CM | POA: Insufficient documentation

## 2012-08-20 DIAGNOSIS — Z8719 Personal history of other diseases of the digestive system: Secondary | ICD-10-CM | POA: Insufficient documentation

## 2012-08-20 LAB — CBC WITH DIFFERENTIAL/PLATELET
Basophils Absolute: 0 10*3/uL (ref 0.0–0.1)
Basophils Relative: 0 % (ref 0–1)
Lymphocytes Relative: 13 % (ref 12–46)
MCHC: 34.1 g/dL (ref 30.0–36.0)
Neutro Abs: 8.4 10*3/uL — ABNORMAL HIGH (ref 1.7–7.7)
Neutrophils Relative %: 82 % — ABNORMAL HIGH (ref 43–77)
Platelets: 177 10*3/uL (ref 150–400)
RDW: 13.2 % (ref 11.5–15.5)
WBC: 10.3 10*3/uL (ref 4.0–10.5)

## 2012-08-20 LAB — URINALYSIS, ROUTINE W REFLEX MICROSCOPIC
Glucose, UA: NEGATIVE mg/dL
Ketones, ur: NEGATIVE mg/dL
Leukocytes, UA: NEGATIVE
Nitrite: NEGATIVE
Protein, ur: NEGATIVE mg/dL
Urobilinogen, UA: 0.2 mg/dL (ref 0.0–1.0)

## 2012-08-20 LAB — COMPREHENSIVE METABOLIC PANEL
ALT: <5 U/L (ref 0–35)
AST: 14 U/L (ref 0–37)
Albumin: 3.9 g/dL (ref 3.5–5.2)
CO2: 26 mEq/L (ref 19–32)
Chloride: 102 mEq/L (ref 96–112)
Creatinine, Ser: 0.71 mg/dL (ref 0.50–1.10)
GFR calc non Af Amer: >90 mL/min (ref >90)
Sodium: 137 mEq/L (ref 135–145)
Total Bilirubin: 0.4 mg/dL (ref 0.3–1.2)

## 2012-08-20 MED ORDER — OXYCODONE-ACETAMINOPHEN 5-325 MG PO TABS: 2.0000 | ORAL_TABLET | ORAL | Status: DC | PRN

## 2012-08-20 MED ORDER — SODIUM CHLORIDE 0.9 % IV BOLUS (SEPSIS)
1000.0000 mL | Freq: Once | INTRAVENOUS | Status: AC
  Administered 2012-08-20: 1000 mL via INTRAVENOUS

## 2012-08-20 MED ORDER — ONDANSETRON HCL 4 MG/2ML IJ SOLN
4.0000 mg | Freq: Once | INTRAMUSCULAR | Status: AC
  Administered 2012-08-20: 4 mg via INTRAVENOUS
  Filled 2012-08-20: qty 2

## 2012-08-20 MED ORDER — IOHEXOL 300 MG/ML  SOLN
80.0000 mL | Freq: Once | INTRAMUSCULAR | Status: AC | PRN
  Administered 2012-08-20: 80 mL via INTRAVENOUS

## 2012-08-20 MED ORDER — IOHEXOL 300 MG/ML  SOLN
25.0000 mL | INTRAMUSCULAR | Status: AC
  Administered 2012-08-20: 25 mL via ORAL

## 2012-08-20 MED ORDER — MORPHINE SULFATE 4 MG/ML IJ SOLN
4.0000 mg | Freq: Once | INTRAMUSCULAR | Status: AC
  Administered 2012-08-20: 4 mg via INTRAVENOUS
  Filled 2012-08-20: qty 1

## 2012-08-20 NOTE — ED Notes (Signed)
Ct called to notify pt done drinking contrast.

## 2012-08-20 NOTE — ED Provider Notes (Signed)
History    CSN: [id number] Arrival date & time 08/20/12  0459  First MD Initiated Contact with Patient 08/20/12 [phone]     Chief Complaint  Patient presents with  . Abdominal Pain   (Consider location/radiation/quality/duration/timing/severity/associated sxs/prior Treatment) HPI Comments: Patient with a five day history of lower abdominal pain.  She was seen at [facility] two days ago for similar complaints and was diagnosed with possible PID.  She was treated and discharged.  She is currently on doxycycline but is not improving.  No fevers or chills.  No diarrhea, constipation, or bloody stool.  LMP was June 10.    Patient is a 47 y.o. female presenting with abdominal pain. The history is provided by the patient.  Abdominal Pain This is a new problem. Episode onset: 5 days ago. The problem occurs constantly. The problem has been gradually worsening. Associated symptoms include abdominal pain. The symptoms are aggravated by twisting, standing and walking. Treatments tried: doxycyclin. The treatment provided no relief.   Past Medical History  Diagnosis Date  . GERD (gastroesophageal reflux disease)    History reviewed. No pertinent past surgical history. Family History  Problem Relation Age of Onset  . Asthma Father    History  Substance Use Topics  . Smoking status: Never Smoker   . Smokeless tobacco: Not on file  . Alcohol Use: No   OB History   Grav Para Term Preterm Abortions TAB SAB Ect Mult Living                 Review of Systems  Gastrointestinal: Positive for abdominal pain.  All other systems reviewed and are negative.    Allergies  Review of patient's allergies indicates no known allergies.  Home Medications   Current Outpatient Rx  Name  Route  Sig  Dispense  Refill  . doxycycline (VIBRA-TABS) 100 MG tablet   Oral   Take 1 tablet (100 mg total) by mouth 2 (two) times daily.   20 tablet   0    BP 125/75  Pulse 74  Temp(Src) 97.6 F (36.4 C) (Oral)  Resp  20  SpO2 99%  LMP 07/30/2012 Physical Exam  Nursing note and vitals reviewed. Constitutional: She is oriented to person, place, and time. She appears well-developed and well-nourished. No distress.  HENT:  Head: Normocephalic and atraumatic.  Mouth/Throat: Oropharynx is clear and moist.  Neck: Normal range of motion. Neck supple.  Cardiovascular: Normal rate, regular rhythm and normal heart sounds.   No murmur heard. Pulmonary/Chest: Effort normal and breath sounds normal. No respiratory distress. She has no wheezes.  Abdominal: Soft. Bowel sounds are normal. She exhibits no distension. There is tenderness.  There is ttp in the right and left lower abdomen without rebound or guarding.  Bowel sounds are present.  Musculoskeletal: Normal range of motion. She exhibits no edema.  Neurological: She is alert and oriented to person, place, and time.  Skin: Skin is warm and dry. She is not diaphoretic.    ED Course  Procedures (including critical care time) Labs Reviewed  CBC WITH DIFFERENTIAL - Abnormal; Notable for the following:    Neutrophils Relative % 82 (*)    Neutro Abs 8.4 (*)    All other components within normal limits  COMPREHENSIVE METABOLIC PANEL - Abnormal; Notable for the following:    Potassium 3.4 (*)    Glucose, Bld 103 (*)    All other components within normal limits  URINALYSIS, ROUTINE W REFLEX MICROSCOPIC  POCT PREGNANCY,  URINE   No results found. No diagnosis found.   MDM  The workup reveals normal labs and urine.  The wet prep and cultures from two days ago were okay and do not feel the need to repeat this.  The ct scan does show bilateral ovarian cysts.  Will treat with pain meds, gyn follow up.  Geoffery Lyons, MD 08/20/12 1118

## 2012-08-20 NOTE — ED Notes (Signed)
Pt c/o right lower abdominal pain. Denies rebound tenderness, N/V. Pt. Does report pain with urination. Seen at urgent care for same.

## 2012-08-22 ENCOUNTER — Encounter: Payer: Self-pay | Admitting: Obstetrics & Gynecology

## 2012-08-22 ENCOUNTER — Ambulatory Visit (INDEPENDENT_AMBULATORY_CARE_PROVIDER_SITE_OTHER): Payer: BC Managed Care – PPO | Admitting: Obstetrics & Gynecology

## 2012-08-22 VITALS — BP 106/63 | HR 68 | Temp 97.2°F | Ht 60.0 in | Wt 123.6 lb

## 2012-08-22 DIAGNOSIS — N949 Unspecified condition associated with female genital organs and menstrual cycle: Secondary | ICD-10-CM

## 2012-08-22 DIAGNOSIS — R102 Pelvic and perineal pain: Secondary | ICD-10-CM

## 2012-08-22 DIAGNOSIS — N83209 Unspecified ovarian cyst, unspecified side: Secondary | ICD-10-CM

## 2012-08-22 MED ORDER — IBUPROFEN 600 MG PO TABS: 600.0000 mg | ORAL_TABLET | Freq: Four times a day (QID) | ORAL | Status: DC | PRN

## 2012-08-22 NOTE — Progress Notes (Signed)
Patient ID: Megan Ford, female   DOB: April 24, 1965, 47 y.o.   MRN: [id number]  Chief Complaint  Patient presents with  . Referral    ED for ovarian cysts    HPI Megan Ford is a 47 y.o. female.  [id number] Patient's last menstrual period was 07/30/2012. Pelvic pain evaluated in UC 1 week ago, tx for PID. Seen in ED 7/1 with pain and CT showed bilateral ovarian cyst 5 cm. Pain is improving, no bleeding or discharge.  HPI  Past Medical History  Diagnosis Date  . GERD (gastroesophageal reflux disease)     History reviewed. No pertinent past surgical history.  Family History  Problem Relation Age of Onset  . Asthma Father     Social History History  Substance Use Topics  . Smoking status: Never Smoker   . Smokeless tobacco: Never Used  . Alcohol Use: No    No Known Allergies  Current Outpatient Prescriptions  Medication Sig Dispense Refill  . doxycycline (VIBRA-TABS) 100 MG tablet Take 1 tablet (100 mg total) by mouth 2 (two) times daily.  20 tablet  0  . oxyCODONE-acetaminophen (PERCOCET) 5-325 MG per tablet Take 2 tablets by mouth every 4 (four) hours as needed for pain.  20 tablet  0  . ranitidine (ZANTAC) 300 MG tablet Take 300 mg by mouth at bedtime.      Marland Kitchen ibuprofen (ADVIL,MOTRIN) 600 MG tablet Take 1 tablet (600 mg total) by mouth every 6 (six) hours as needed for pain.  30 tablet  1   No current facility-administered medications for this visit.    Review of Systems Review of Systems  Constitutional: Negative for fever.  Gastrointestinal: Positive for abdominal pain. Negative for vomiting, abdominal distention and rectal pain.  Genitourinary: Positive for urgency, frequency and pelvic pain. Negative for dysuria, vaginal bleeding, vaginal discharge and dyspareunia. Menstrual problem: dysmenorrhea.    Blood pressure 106/63, pulse 68, temperature 97.2 F (36.2 C), temperature source Oral, height 5' (1.524 m), weight 123 lb 9.6 oz (56.065 kg), last menstrual period  07/30/2012.  Physical Exam Physical Exam  Constitutional: She is oriented to person, place, and time. She appears well-developed and well-nourished. No distress.  HENT:  Head: Normocephalic.  Pulmonary/Chest: Effort normal. No respiratory distress.  Abdominal: Soft. She exhibits no mass. There is tenderness (mild). There is no rebound and no guarding.  Genitourinary: Vagina normal and uterus normal. No vaginal discharge found.  No mass or tenderness  Neurological: She is alert and oriented to person, place, and time.  Skin: Skin is warm and dry.  Psychiatric: She has a normal mood and affect. Her behavior is normal.    Data Reviewed   *RADIOLOGY REPORT*  Clinical Data: Lower abdominal pain, nausea/vomiting  CT ABDOMEN AND PELVIS WITH CONTRAST  Technique: Multidetector CT imaging of the abdomen and pelvis was  performed following the standard protocol during bolus  administration of intravenous contrast.  Contrast: 80mL OMNIPAQUE IOHEXOL 300 MG/ML SOLN  Comparison: None.  Findings: Motion degraded images.  Lung bases are poorly evaluated secondary to respiratory motion.  Liver, spleen, pancreas, and adrenal glands are within normal  limits.  Gallbladder is unremarkable. No intrahepatic or extrahepatic  ductal dilatation.  Kidneys are within normal limits. Right extrarenal pelvis. Mild  left hydroureteronephrosis.  No evidence of bowel obstruction. Normal appendix.  No evidence of abdominal aortic aneurysm.  No abdominopelvic ascites.  No suspicious abdominopelvic lymphadenopathy.  Uterus is unremarkable. 5.0 x 3.9 cm septated right ovarian cyst  (series 2/image  56). 5.0 x 3.8 cm left ovarian cyst (series  2/image 59).  No ureteral or bladder calculi. Possible extrinsic compression of  the distal left ureter passes adjacent to the uterus/left ovary  (series 2/image 56). Distended bladder.  Mild degenerative changes of the visualized thoracolumbar spine.  IMPRESSION:   Motion degraded images.  Mild left hydroureteronephrosis, possibly on the basis of extrinsic  compression. No ureteral or bladder calculi.  Bilateral ovarian cysts measuring up to 5.0 cm, as described above.  Consider pelvic ultrasound for further evaluation.  Distended bladder.  Original Report Authenticated By: Charline Bills, M.D. CBC    Component Value Date/Time   WBC 10.3 08/20/2012 0550   WBC 6.5 08/17/2012 1218   RBC 4.56 08/20/2012 0550   RBC 4.65 08/17/2012 1218   HGB 12.3 08/20/2012 0550   HGB 12.5 08/17/2012 1218   HCT 36.1 08/20/2012 0550   HCT 39.5 08/17/2012 1218   PLT 177 08/20/2012 0550   MCV 79.2 08/20/2012 0550   MCV 84.9 08/17/2012 1218   MCH 27.0 08/20/2012 0550   MCH 26.9* 08/17/2012 1218   MCHC 34.1 08/20/2012 0550   MCHC 31.6* 08/17/2012 1218   RDW 13.2 08/20/2012 0550   LYMPHSABS 1.3 08/20/2012 0550   MONOABS 0.4 08/20/2012 0550   EOSABS 0.1 08/20/2012 0550   BASOSABS 0.0 08/20/2012 0550     Assessment    Pelvic pain, possible PID with ovarian cysts and negative cultures     Plan    Finish antibiotics Pelvic US 6 weeks RTC 2 weeks Ibuprofen prn        Jihan Rudy 08/22/2012, 1:41 PM

## 2012-08-22 NOTE — Patient Instructions (Signed)
U Nang Bu?ng Tr?ng (Ovarian Cyst) Bu?ng tr?ng l c? quan nh? trn m?i bn c?a t? cung. Bu?ng tr?ng l c? quan s?n sinh hocmon n?, estrogen v hocmon gi?i tnh duy tr [place]. U nang bu?ng tr?ng l m?t ti ch?a ??y d?ch c th? c kch c? khc nhau. Vi?c m?t u nang nh? hnh thnh trong ph? n? ? ?? tu?i sinh con v c kinh nguy?t l ?i?u bnh th??ng. Lo?i u nang ny ???c g?i l u nang kn tr? thnh u nang r?ng tr?ng (u nang hong th?) sau khi n s?n sinh ra tr?ng c?a ph? n?. Sau ? n s? t? h?t n?u ng??i ph? n? khng mang [place]. C cc lo?i u nang bu?ng tr?ng khc c th? gy ra v?n ?? v c th? c?n ???c ?i?u tr?. V?n ?? nghim tr?ng nh?t l u nang c ung th?. C?n l?u  r?ng ph? n? mn kinh c u nang bu?ng tr?ng c nguy c? b? u nang ung th? cao h?n. Chng c?n ???c ?nh gi r?t nhanh, k? v c?n ???c theo di st sao. ?i?u ny ??c bi?t ?ng v?i ph? n? mn kinh v t? l? ung th? bu?ng tr?ng ? ph? n? mn kinh l kh cao. NGUYN NHN V CC LO?I U NANG BU?NG TR?NG:  U NANG CH?C N?NG: U nang th? vng/kn l m?t u nang ch?c n?ng xu?t hi?n hng thng trong khi r?ng tr?ng v?i chu k? kinh nguy?t. Chng s? h?t cng v?i chu k? kinh nguy?t ti?p theo n?u ng??i ph? n? khng mang [place]. Th??ng s? khng c tri?u ch?ng v?i m?t u nang ch?c n?ng.  U N?I M?C C? T? CUNG: U nang ny pht tri?n t? l?p lt c?a m t? cung. U nang ny ?i vo ho?c trn bu?ng tr?ng. N pht tri?n hng thng t? s? ch?y mu trong chu k? kinh nguy?t. N cn ???c g?i l "u nang scla" v n ch?a ??y mu chuy?n thnh mu nu. U nang ny gy ?au b?ng d??i trong khi giao h?p v v?i chu k? kinh nguy?t.  U NANG TUY?N: U nang ny pht tri?n t? nh?ng t? bo ? bn ngoi bu?ng tr?ng. Chng th??ng khng ph?i l ung th?[place] c th? tr? nn r?t l?n v gy ?au b?ng d??i v ?au khi giao h?p. Lo?i u nang ny c th? t? xo?n, c?t ??t ngu?n cung c?p mu c?a n v gy ra ?au n?ng. N c?ng c th? d? dng ??t gy v gy ra ?au nhi?u.  U NANG DA: Lo?i u nang ny ?i khi ???c pht  hi?n th?y trong c? hai bu?ng tr?ng. Ng??i ta ? pht hi?n th?y r?ng chng c cc lo?i m c? th? khc nhau trong u nang. M ny bao g?m da, r?ng, tc v/ho?c s?n. Chng th??ng khng c tri?u ch?ng tr? khi tr? nn r?t l?n. U nang da hi?m khi l ung th?.  BU?NG TR?NG ?AU NANG: ?y l tnh tr?ng hi?m khi g?p v?i v?n ?? hocmon s?n sinh nhi?u u nang nh? trn c? hai bu?ng tr?ng. Cc u nang ny l cc u nang d?ng kn khng bao gi? s?n sinh tr?ng v tr? thnh th? vng. N c th? lm t?ng tr?ng l??ng c? th?, v sinh, vim nang lng, lm cho c? th? v m?t m?c nhi?u lng h?n, ??ng th?i t kinh nguy?t h?n ho?c hi?m khi c kinh nguy?t. Nhi?u ph? n? c v?n ?? ny pht tri?n ti?u ???ng lo?i 2. Nguyn nhn chnh  xc gy ra v?n ?? ny khng ???c xc ??nh. Bu?ng tr?ng ?au nang hi?m khi l ung th?.  U NANG T? BO V? NANG LUTEIN: Xu?t hi?n khi qu nhi?u hocmon (hormon gonadotropin mng ??m ? ng??i) ???c s?n sinh v kch thch qu m?c bu?ng tr?ng ?? sinh tr?ng. Chng th??ng ???c nhn th?y khi bc s? kch thch bu?ng tr?ng ?? th? tinh trong ?ng nghi?m.  U HONG TH?: U nang ny ???c pht hi?n th?y trong khi mang [place]. N hi?m khi lm t?c ???ng sinh trong khi chuy?n d? v khi sinh. Chng th??ng s? h?t sau khi sinh. TRI?U CH?NG  ?au ho?c t?c khung x??ng ch?u.  ?au trong khi giao h?p.  Vng b?ng t?ng (s?ng t?y).  Chu k? kinh nguy?t khng bnh th??ng.  ?au gia t?ng trong chu k? kinh nguy?t.  B?n khng c chu k? kinh nguy?t v b?n khng mang [place]. CH?N ?ON Ch?n ?on c th? ???c th?c hi?n trong khi:  Ki?m tra khung x??ng ch?u ??nh k? ho?c hng n?m (ph? bi?n).  Siu m.  Ch?p X quang x??ng ch?u.  Ch?p CT.  MRI.  Xt nghi?m mu. ?I?U TR?  Cch ?i?u tr? c th? ch? l theo di u nang hng thng trong 2 ??n 3 thng v?i chuyn gia ch?m [place] y t?. Nhi?u u nang s? t? h?t, ??c bi?t l cc u nang ch?c n?ng.  C th? ???c ht (ht kh) b?ng kim di cng siu m, ho?c soi ? b?ng (c?m ?ng vo khung x??ng ch?u qua m?t v?t  r?ch nh?).  C th? c?t ton b? u nang b?ng ph??ng php soi ? b?ng.  ?i khi u nang c th? ???c c?t qua m?t v?t r?ch trong b?ng d??i.  ?i?u tr? hocmon ?i khi ???c s? d?ng ?? gip lm tan m?t s? u nang nh?t ??nh.  Vin thu?c trnh [place] ?i khi ???c s? d?ng ?? gip lm tan m?t s? u nang nh?t ??nh. H??NG D?N CH?M [place] T?I NH Th?c hi?n theo l?i khuyn c?a chuyn gia ch?m [place] y t? lin quan ??n:  Thu?c.  Khm l?i ?? ?nh gi v ?i?u tr? u nang.  B?n c th? c?n tr? l?i ho?c h?n m?t chuyn gia ch?m [place] y t? khc, ?? tm nguyn nhn chnh xc gy ra u nang, n?u chuyn gia ch?m [place] y t? khng ph?i l m?t bc s? ph? khoa.  D?y s?m v ki?m tra khung x??ng ch?u v xt nghi?m [place].  Cho chuyn gia ch?m [place] y t? c?a b?n bi?t n?u b?n c u nang bu?ng tr?ng trong qu kh?. HY THAM V?N V?I CHUYN GIA Y T? N?U:  Chu k? kinh nguy?t ch?m, khng ??u, d?ng ho?c ?au.  ?au b?ng ho?c ?au khung x??ng ch?u khng h?t.  D? dy phnh to ho?c s?ng.  B?n c?m th?y t?c bng quang ho?c kh ?i ti?u h?t s?ch hon ton.  B?n b? ?au khi giao h?p.  B?n c?m th?y ??y b?ng, t?c b?ng ho?c kh ch?u trong d? dy.  B?n b? gi?m cn khng c l do r rng.  B?n c?m th?y m?t m?i ni chung.  B?n b? to bn.  B?n m?t c?m gic ngon mi?ng.  B?n pht tri?n vim nang lng.  C? th? v m?t b?n m?c nhi?u lng h?n.  B?n b? t?ng cn, m?c d khng thay ??i ch? ?? t?p luy?n v thi quen ?n u?ng.  B?n ngh? mnh c b?u. HY NGAY L?P T?C THAM  V?N V?I CHUYN GIA Y T? N?U:  B?n b? ?au b?ng gia t?ng.  B?n c?m th?y kh ch?u trong d? dy (bu?n nn) v/ho?c nn.  B?n pht tri?n s?t xu?t hi?n ??t ng?t.  B?n pht tri?n ?au b?ng trong khi di chuy?n ru?t.  Chu k? kinh nguy?t c?a b?n tr? nn n?ng h?n bnh th??ng. Document Released: 02/06/2005 Document Revised: 05/01/2011 [employer] Patient Information 2014 [place], [place].

## 2012-09-05 ENCOUNTER — Ambulatory Visit (INDEPENDENT_AMBULATORY_CARE_PROVIDER_SITE_OTHER): Payer: BC Managed Care – PPO | Admitting: Obstetrics & Gynecology

## 2012-09-05 ENCOUNTER — Encounter: Payer: Self-pay | Admitting: Obstetrics & Gynecology

## 2012-09-05 VITALS — BP 100/69 | HR 72 | Temp 97.5°F | Ht 60.0 in | Wt 122.4 lb

## 2012-09-05 DIAGNOSIS — R102 Pelvic and perineal pain: Secondary | ICD-10-CM

## 2012-09-05 DIAGNOSIS — N949 Unspecified condition associated with female genital organs and menstrual cycle: Secondary | ICD-10-CM

## 2012-09-05 LAB — POCT URINALYSIS DIP (DEVICE)
Ketones, ur: NEGATIVE mg/dL
Protein, ur: NEGATIVE mg/dL
pH: 6 (ref 5.0–8.0)

## 2012-09-05 MED ORDER — SULFAMETHOXAZOLE-TRIMETHOPRIM 800-160 MG PO TABS: 1.0000 | ORAL_TABLET | Freq: Two times a day (BID) | ORAL | Status: DC

## 2012-09-05 NOTE — Patient Instructions (Signed)
Urinary Tract Infection Urinary tract infections (UTIs) can develop anywhere along your urinary tract. Your urinary tract is your body's drainage system for removing wastes and extra water. Your urinary tract includes two kidneys, two ureters, a bladder, and a urethra. Your kidneys are a pair of bean-shaped organs. Each kidney is about the size of your fist. They are located below your ribs, one on each side of your spine. CAUSES Infections are caused by microbes, which are microscopic organisms, including fungi, viruses, and bacteria. These organisms are so small that they can only be seen through a microscope. Bacteria are the microbes that most commonly cause UTIs. SYMPTOMS  Symptoms of UTIs may vary by age and gender of the patient and by the location of the infection. Symptoms in young women typically include a frequent and intense urge to urinate and a painful, burning feeling in the bladder or urethra during urination. Older women and men are more likely to be tired, shaky, and weak and have muscle aches and abdominal pain. A fever may mean the infection is in your kidneys. Other symptoms of a kidney infection include pain in your back or sides below the ribs, nausea, and vomiting. DIAGNOSIS To diagnose a UTI, your caregiver will ask you about your symptoms. Your caregiver also will ask to provide a urine sample. The urine sample will be tested for bacteria and white blood cells. White blood cells are made by your body to help fight infection. TREATMENT  Typically, UTIs can be treated with medication. Because most UTIs are caused by a bacterial infection, they usually can be treated with the use of antibiotics. The choice of antibiotic and length of treatment depend on your symptoms and the type of bacteria causing your infection. HOME CARE INSTRUCTIONS  If you were prescribed antibiotics, take them exactly as your caregiver instructs you. Finish the medication even if you feel better after you  have only taken some of the medication.  Drink enough water and fluids to keep your urine clear or pale yellow.  Avoid caffeine, tea, and carbonated beverages. They tend to irritate your bladder.  Empty your bladder often. Avoid holding urine for long periods of time.  Empty your bladder before and after sexual intercourse.  After a bowel movement, women should cleanse from front to back. Use each tissue only once. SEEK MEDICAL CARE IF:   You have back pain.  You develop a fever.  Your symptoms do not begin to resolve within 3 days. SEEK IMMEDIATE MEDICAL CARE IF:   You have severe back pain or lower abdominal pain.  You develop chills.  You have nausea or vomiting.  You have continued burning or discomfort with urination. MAKE SURE YOU:   Understand these instructions.  Will watch your condition.  Will get help right away if you are not doing well or get worse. Document Released: 11/16/2004 Document Revised: 08/08/2011 Document Reviewed: 03/17/2011 [facility] Patient Information 2014 [place], [place]. Nhi?m Trng ???ng Ti?t Ni?u  (Urinary Tract Infection)  Nhi?m trng ???ng ti?t ni?u (UTI) c th? pht tri?n ?? b?t c? vi? tri? na?o d?c ???ng ti?t ni?u. ???ng ti?t ni?u l h? th?ng thot n??c c?a c? th? ?? lo?i b? ch?t th?i v n??c d? th?a. ???ng ti?t ni?u bao g?m hai th?n, ni?u qu?n, bng quang v ni?u ??o. Th?n l m?t c?p c? quan hnh h?t ??u. M?i th?n co? kch th??c kho?ng b?ng bn tay c?a b?n. Chng n?m d??i x??ng s??n, m?i bn c?t s?ng c m?t qu?Marland Kitchen  NGUYN NHN  Nhi?m trng gy b?i vi sinh v?t, l cc sinh v?t nh?, bao g?m n?m, vi rt v vi khu?n. Nh?ng sinh v?t ny nh? t?i m?c chng ch? c th? ???c nhn th?y qua knh hi?n vi. Vi khu?n l nh?ng vi sinh v?t ph? bi?n nh?t gy ra UTI.  TRI?U CH?NG  Cc tri?u ch?ng c?a UTI c th? khc nhau, ty theo ?? tu?i v gi?i tnh c?a b?nh nhn v b?i v? tr nhi?m trng. Cc tri?u ch?ng ? ph? n? tr? th??ng bao g?m nhu c?u ti?u ti?n  th??ng xuyn v d? d?i, c?m gic ?au v rt ? bng quang ho?c ni?u ??o khi ?i ti?u. Ph? n? v nam gi?i l?n tu?i c nhi?u kh? n?ng b? m?t m?i, run r?y v y?u, ??ng th?i b? ?au c? v ?au b?ng. S?t c th? c ngh?a l nhi?m trng ? th?n. Cc tri?u ch?ng khc c?a nhi?m trng th?n bao g?m ?au ? l?ng ho?c hai bn d??i x??ng s??n, bu?n nn v nn m?a.  CH?N ?ON  ?? ch?n ?on UTI, chuyn gia ch?m [place] y t? s? h?i b?n v? nh?ng tri?u ch?ng c?a b?n. Chuyn gia ch?m [place] y t? c?ng s? yu c?u cung c?p m?u n??c ti?u. M?u n??c ti?u s? ???c xt nghi?m xem c vi khu?n v cc t? bo mu tr?ng khng. Cc t? bo mu tr?ng ???c t?o b?i c? th? ?? gip ch?ng l?i nhi?m trng.  ?I?U TR?  Thng th??ng, UTI c th? ???c ?i?u tr? b?ng thu?c. B?i v h?u h?t nguyn nhn gy ra UTI l do nhi?m trng b?i vi khu?n, chng th??ng c th? ???c ?i?u tr? b?ng cch s? d?ng thu?c khng sinh. Vi?c l?a ch?n khng sinh v th?i gian ?i?u tr? ph? thu?c vo tri?u ch?ng v lo?i vi khu?n gy nhi?m trng.  H??NG D?N CH?M [place] T?I NH   N?u b?n ? ???c k khng sinh, hy s? d?ng chng ?ng nh? chuyn gia ch?m [place] y t? h??ng d?n. S? d?ng h?t thu?c ngay c? khi b?n c?m th?y kh h?n sau khi ch? dng m?t ph?n thu?c.  U?ng ?? n??c v dung d?ch ?? n??c ti?u trong ho?c c mu vng nh?t.  Trnh caffeine, tr v ?? u?ng c ga. Chng c xu h??ng kch thch bng quang.  ?i ti?u th??ng xuyn. Trnh nh?n ti?u trong th?i gian di.  ?i ti?u tr??c v sau khi quan h? tnh d?c.  Sau khi ?i ??i ti?n, ph? n? c?n lm s?ch t? tr??c ra sau. Ch? s? d?ng gi?y v? sinh m?t l?n. HY THAM V?N V?I CHUYN GIA Y T? N?U:   B?n b? ?au l?ng.  B?n b? s?t.  Cc tri?u ch?ng c?a b?n khng b?t ??u ?? trong vng 3 ngy. HY NGAY L?P T?C THAM V?N V?I CHUYN GIA Y T? N?U:   B?n b? ?au l?ng ho?c ?au b?ng d??i nghim tr?ng.  B?n pht tri?n ?n l?nh.  B?n b? bu?n nn ho?c nn m?a.  B?n b? rt ho?c kh ch?u ti?p t?c khi ?i ti?u. ??M B?O B?N:   Hi?u cc h??ng d?n ny.  S? theo di  tnh tr?ng c?a mnh.  S? yu c?u tr? gip ngay l?p t?c n?u b?n c?m th?y khng kh?e ho?c tnh tr?ng tr? nn t?i h?n. Document Released: 02/06/2005 Document Revised: 08/08/2011 [facility] Patient Information 2014 [place], [place].

## 2012-09-05 NOTE — Progress Notes (Signed)
Subjective:     Patient ID: Megan Ford, female   DOB: [birth year], 47 y.o.   MRN: [id number]  HPIS/P tx for PID, needs f/u US for ovarian cysts   Review of Systems  Constitutional: Negative for fever.  Genitourinary: Positive for dysuria and frequency. Negative for vaginal discharge and pelvic pain.       Objective:   Physical Exam  Nursing note and vitals reviewed. Constitutional: She appears well-developed. No distress.  Abdominal: She exhibits no distension and no mass. There is no tenderness.  Skin: Skin is warm and dry.  Psychiatric: She has a normal mood and affect. Her behavior is normal.   Filed Vitals:   09/05/12 1525  BP: 100/69  Pulse: 72  Temp: 97.5 F (36.4 C)    Urinalysis    Component Value Date/Time   COLORURINE YELLOW 08/20/2012 0636   APPEARANCEUR CLEAR 08/20/2012 0636   LABSPEC <=1.005 09/05/2012 1530   PHURINE 6.0 09/05/2012 1530   GLUCOSEU NEGATIVE 09/05/2012 1530   HGBUR MODERATE* 09/05/2012 1530   BILIRUBINUR NEGATIVE 09/05/2012 1530   BILIRUBINUR neg 08/17/2012 1133   KETONESUR NEGATIVE 09/05/2012 1530   PROTEINUR NEGATIVE 09/05/2012 1530   UROBILINOGEN 0.2 09/05/2012 1530   UROBILINOGEN 0.2 08/17/2012 1133   NITRITE NEGATIVE 09/05/2012 1530   NITRITE neg 08/17/2012 1133   LEUKOCYTESUR MODERATE* 09/05/2012 1530         Assessment:     Doing well afgter Tx for PID, now suspect UTI     Plan:     [place] and f/u 4 weeks Bactrim ds bid for 3 days. Urine culture  Adam Phenix, MD 09/05/2012

## 2012-09-12 ENCOUNTER — Telehealth: Payer: Self-pay | Admitting: Obstetrics and Gynecology

## 2012-09-12 NOTE — Telephone Encounter (Addendum)
Message copied by Toula Moos on Thu Sep 12, 2012 11:47 AM   ------ Called patient. No answer and VM full- unable to leave message. Will try again.        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

## 2012-09-16 MED ORDER — CIPROFLOXACIN HCL 250 MG PO TABS: 250.0000 mg | ORAL_TABLET | Freq: Two times a day (BID) | ORAL | Status: DC

## 2012-09-16 NOTE — Telephone Encounter (Signed)
[employer] interpreters and they were unable to obtain an interpreter will send out certified letter. Certified letter sent.

## 2012-10-03 ENCOUNTER — Ambulatory Visit (HOSPITAL_COMMUNITY)
Admission: RE | Admit: 2012-10-03 | Discharge: 2012-10-03 | Disposition: A | Discharge status: Home / Self Care | Payer: BC Managed Care – PPO | Source: Ambulatory Visit | Attending: Obstetrics & Gynecology | Admitting: Obstetrics & Gynecology

## 2012-10-03 DIAGNOSIS — N949 Unspecified condition associated with female genital organs and menstrual cycle: Secondary | ICD-10-CM | POA: Insufficient documentation

## 2012-10-03 DIAGNOSIS — R102 Pelvic and perineal pain: Secondary | ICD-10-CM

## 2012-10-03 DIAGNOSIS — N83209 Unspecified ovarian cyst, unspecified side: Secondary | ICD-10-CM | POA: Insufficient documentation

## 2012-10-15 ENCOUNTER — Encounter: Payer: Self-pay | Admitting: Obstetrics & Gynecology

## 2012-10-22 ENCOUNTER — Encounter: Payer: Self-pay | Admitting: *Deleted

## 2012-11-04 ENCOUNTER — Ambulatory Visit (INDEPENDENT_AMBULATORY_CARE_PROVIDER_SITE_OTHER): Payer: BC Managed Care – PPO | Admitting: Obstetrics & Gynecology

## 2012-11-04 ENCOUNTER — Encounter: Payer: Self-pay | Admitting: Obstetrics & Gynecology

## 2012-11-04 VITALS — BP 129/82 | HR 79 | Temp 97.3°F | Ht 60.0 in | Wt 123.0 lb

## 2012-11-04 DIAGNOSIS — N949 Unspecified condition associated with female genital organs and menstrual cycle: Secondary | ICD-10-CM

## 2012-11-04 DIAGNOSIS — R102 Pelvic and perineal pain: Secondary | ICD-10-CM

## 2012-11-04 NOTE — Progress Notes (Signed)
  Subjective:    Patient ID: Megan Ford, female    DOB: 15-Nov-1965, 47 y.o.   MRN: [id number]  HPI Patient's last menstrual period was 10/26/2012. [id number] Still some pelvic pain, left or right. [place] 8/14 showed bilateral ovarian cysts possible endometriomas     Review of Systems  Constitutional: Negative for fever.  Genitourinary: Positive for pelvic pain. Negative for urgency and menstrual problem.       Objective:   Physical Exam  Constitutional: She is oriented to person, place, and time. She appears well-developed and well-nourished. No distress.  Neurological: She is alert and oriented to person, place, and time.  Psychiatric: She has a normal mood and affect. Her behavior is normal.     *RADIOLOGY REPORT*  Clinical Data: Right sided pelvic pain. History of ovarian cysts.  LMP 09/26/2012  TRANSABDOMINAL AND TRANSVAGINAL ULTRASOUND OF PELVIS  Technique: Both transabdominal and transvaginal ultrasound  examinations of the pelvis were performed. Transabdominal technique  was performed for global imaging of the pelvis including uterus,  ovaries, adnexal regions, and pelvic cul-de-sac.  It was necessary to proceed with endovaginal exam following the  transabdominal exam to visualize the myometrium, endometrium and  adnexa.  Comparison: CT 08/20/2012  Findings:  Uterus: Is anteverted and anteflexed and demonstrates a sagittal  length of 8.3 cm, depth of 4.3 cm and width of 5.2 cm. No focal  mural abnormality is seen  Endometrium: Is thin and echogenic with a width of 7 mm. No areas  of focal thickening or heterogeneity are seen  Right ovary: Measures 6.1 x 7.0 x 6.4 cm and contains a complex  cystic lesion measuring 5.0 x 5.6 x 4.6 cm which contains diffuse  low level echoes with slightly higher echogenicity dependent low  level echoes. Internal cystic contents are avascular with color  Doppler exam and the appearance is most compatible with an  endometrioma. An acute  hemorrhagic cyst could have a similar  appearance.  Left ovary: Measures 6.7 x 4.2 x 4.9 cm and contains a complex  cystic area measuring 4.6 x 4.8 x 3.3 cm. This contains low-level  echoes with the inferior leads positioned echoes being slightly  more echogenic in nature. Again the appearance is most suspicious  for a endometrioma although a hemorrhagic cyst could have a similar  appearance.  Other findings: No pelvic fluid is seen  IMPRESSION:  Bilateral complex ovarian cyst with an appearance most suspicious  for bilateral endometrioma. Lack of significant change in size  since prior CT 08/20/2012 would make this a more probable diagnosis  than bilateral hemorrhagic cysts. Given the history of pelvic pain,  short-term reevaluation can be performed if desired in the  postsecretory phase of the cycle following the next complete cycle  to confirm this impression. Endometrioma would be expected to  remain unchanged in appearance while hemorrhagic cysts would be  expected to demonstrate interval evolution/resolution.  Original Report Authenticated By: Rhodia Albright, M.D.            Assessment & Plan:  Pelvic pain, ovarian cysts, possible endometriosis. F/U US, RTC to discuss possible laparoscopy if needed  Adam Phenix, MD 11/04/2012

## 2012-11-18 ENCOUNTER — Ambulatory Visit (HOSPITAL_COMMUNITY): Payer: BC Managed Care – PPO

## 2012-11-18 ENCOUNTER — Ambulatory Visit (HOSPITAL_COMMUNITY): Payer: BC Managed Care – PPO | Attending: Obstetrics & Gynecology

## 2012-12-20 ENCOUNTER — Ambulatory Visit (INDEPENDENT_AMBULATORY_CARE_PROVIDER_SITE_OTHER): Payer: BC Managed Care – PPO | Admitting: Obstetrics & Gynecology

## 2012-12-20 ENCOUNTER — Encounter: Payer: Self-pay | Admitting: Obstetrics & Gynecology

## 2012-12-20 VITALS — BP 129/83 | HR 68 | Ht 60.0 in | Wt 127.2 lb

## 2012-12-20 DIAGNOSIS — N83209 Unspecified ovarian cyst, unspecified side: Secondary | ICD-10-CM

## 2012-12-20 NOTE — Patient Instructions (Signed)
Diagnostic Laparoscopy Laparoscopy is a surgical procedure. It is used to diagnose and treat diseases inside the belly(abdomen). It is usually a brief, common, and relatively simple procedure. The laparoscopeis a thin, lighted, pencil-sized instrument. It is like a telescope. It is inserted into your abdomen through a small cut (incision). Your caregiver can look at the organs inside your body through this instrument. He or she can see if there is anything abnormal. Laparoscopy can be done either in a hospital or outpatient clinic. You may be given a mild sedative to help you relax before the procedure. Once in the operating room, you will be given a drug to make you sleep (general anesthesia). Laparoscopy usually lasts less than 1 hour. After the procedure, you will be monitored in a recovery area until you are stable and doing well. Once you are home, it will take 2 to 3 days to fully recover. RISKS AND COMPLICATIONS  Laparoscopy has relatively few risks. Your caregiver will discuss the risks with you before the procedure. Some problems that can occur include:  Infection.  Bleeding.  Damage to other organs.  Anesthetic side effects. PROCEDURE Once you receive anesthesia, your surgeon inflates the abdomen with a harmless gas (carbon dioxide). This makes the organs easier to see. The laparoscope is inserted into the abdomen through a small incision. This allows your surgeon to see into the abdomen. Other small instruments are also inserted into the abdomen through other small openings. Many surgeons attach a video camera to the laparoscope to enlarge the view. During a diagnostic laparoscopy, the surgeon may be looking for inflammation, infection, or cancer. Your surgeon may take tissue samples(biopsies). The samples are sent to a specialist in looking at cells and tissue samples (pathologist). The pathologist examines them under a microscope. Biopsies can help to diagnose or confirm a  disease. AFTER THE PROCEDURE   The gas is released from inside the abdomen.  The incisions are closed with stitches (sutures). Because these incisions are small (usually less than 1/2 inch), there is usually minimal discomfort after the procedure. There may be some mild discomfort in the throat. This is from the tube placed in the throat while you were sleeping. You may have some mild abdominal discomfort. There may also be discomfort from the instrument placement incisions in the abdomen.  The recovery time is shortened as long as there are no complications.  You will rest in a recovery room until stable and doing well. As long as there are no complications, you may be allowed to go home. FINDING OUT THE RESULTS OF YOUR TEST Not all test results are available during your visit. If your test results are not back during the visit, make an appointment with your caregiver to find out the results. Do not assume everything is normal if you have not heard from your caregiver or the medical facility. It is important for you to follow up on all of your test results. HOME CARE INSTRUCTIONS   Take all medicines as directed.  Only take over-the-counter or prescription medicines for pain, discomfort, or fever as directed by your caregiver.  Resume daily activities as directed.  Showers are preferred over baths.  You may resume sexual activities in 1 week or as directed.  Do not drive while taking narcotics. SEEK MEDICAL CARE IF:   There is increasing abdominal pain.  There is new pain in the shoulders (shoulder strap areas).  You feel lightheaded or faint.  You have the chills.  You or your   child has an oral temperature above 102 F (38.9 C).  There is pus-like (purulent) drainage from any of the wounds.  You are unable to pass gas or have a bowel movement.  You feel sick to your stomach (nauseous) or throw up (vomit). MAKE SURE YOU:   Understand these instructions.  Will watch  your condition.  Will get help right away if you are not doing well or get worse. Document Released: 05/15/2000 Document Revised: 05/01/2011 Document Reviewed: 02/06/2007 [employer] Patient Information 2014 [employer].  

## 2012-12-20 NOTE — Progress Notes (Signed)
Patient ID: Megan Ford, female   DOB: 29-Nov-1965, 47 y.o.   MRN: [id number]  Chief Complaint  Patient presents with  . Follow-up    HPI Megan Ford is a 47 y.o. female.  [id number] Patient's last menstrual period was 11/25/2012. Still with bilateral pelvic pain. F/U US was missed although she says it was done.  HPI  Past Medical History  Diagnosis Date  . GERD (gastroesophageal reflux disease)     History reviewed. No pertinent past surgical history.  Family History  Problem Relation Age of Onset  . Asthma Father     Social History History  Substance Use Topics  . Smoking status: Never Smoker   . Smokeless tobacco: Never Used  . Alcohol Use: No    No Known Allergies  Current Outpatient Prescriptions  Medication Sig Dispense Refill  . ibuprofen (ADVIL,MOTRIN) 600 MG tablet Take 1 tablet (600 mg total) by mouth every 6 (six) hours as needed for pain.  30 tablet  1  . ranitidine (ZANTAC) 300 MG tablet Take 300 mg by mouth 2 (two) times daily.       Marland Kitchen omeprazole (PRILOSEC) 40 MG capsule Take 40 mg by mouth daily.       No current facility-administered medications for this visit.    Review of Systems Review of Systems  Genitourinary: Positive for dysuria and pelvic pain. Negative for vaginal discharge and menstrual problem.    Blood pressure 129/83, pulse 68, height 5' (1.524 m), weight 127 lb 3.2 oz (57.698 kg), last menstrual period 11/25/2012.  Physical Exam Physical Exam  Constitutional: She appears well-developed. No distress.  Pulmonary/Chest: Effort normal. No respiratory distress.  Skin: Skin is warm and dry.  Psychiatric: She has a normal mood and affect. Her behavior is normal.    Data Reviewed [place] result   Assessment    Bilateral endometriomas by [place]     Plan    Laparoscopy, possible cystectomy, oophorectomy. Procedure and the risk of anesthesia, bleeding infection, visceral organ damage were discussed and her questions were answered via  interpreter.        ARNOLD,JAMES 12/20/2012, 11:22 AM

## 2013-01-01 ENCOUNTER — Other Ambulatory Visit: Payer: Self-pay | Admitting: Obstetrics & Gynecology

## 2013-01-03 ENCOUNTER — Emergency Department (HOSPITAL_COMMUNITY): Payer: BC Managed Care – PPO

## 2013-01-03 ENCOUNTER — Encounter (HOSPITAL_COMMUNITY): Payer: Self-pay | Admitting: Emergency Medicine

## 2013-01-03 ENCOUNTER — Emergency Department (HOSPITAL_COMMUNITY)
Admission: EM | Admit: 2013-01-03 | Discharge: 2013-01-03 | Disposition: A | Discharge status: Home / Self Care | Payer: BC Managed Care – PPO | Attending: Emergency Medicine | Admitting: Emergency Medicine

## 2013-01-03 DIAGNOSIS — Z8719 Personal history of other diseases of the digestive system: Secondary | ICD-10-CM | POA: Insufficient documentation

## 2013-01-03 DIAGNOSIS — Z3202 Encounter for pregnancy test, result negative: Secondary | ICD-10-CM | POA: Insufficient documentation

## 2013-01-03 DIAGNOSIS — N949 Unspecified condition associated with female genital organs and menstrual cycle: Secondary | ICD-10-CM | POA: Insufficient documentation

## 2013-01-03 DIAGNOSIS — R102 Pelvic and perineal pain: Secondary | ICD-10-CM

## 2013-01-03 DIAGNOSIS — N83209 Unspecified ovarian cyst, unspecified side: Secondary | ICD-10-CM | POA: Insufficient documentation

## 2013-01-03 HISTORY — DX: Unspecified ovarian cyst, unspecified side: N83.209

## 2013-01-03 LAB — URINALYSIS, ROUTINE W REFLEX MICROSCOPIC
Bilirubin Urine: NEGATIVE
Hgb urine dipstick: NEGATIVE
Nitrite: NEGATIVE
Specific Gravity, Urine: 1.02 (ref 1.005–1.030)
Urobilinogen, UA: 1 mg/dL (ref 0.0–1.0)
pH: 6.5 (ref 5.0–8.0)

## 2013-01-03 LAB — WET PREP, GENITAL
Clue Cells Wet Prep HPF POC: NONE SEEN
Yeast Wet Prep HPF POC: NONE SEEN

## 2013-01-03 LAB — CBC WITH DIFFERENTIAL/PLATELET
Basophils Absolute: 0 10*3/uL (ref 0.0–0.1)
Basophils Relative: 0 % (ref 0–1)
Eosinophils Absolute: 0.1 10*3/uL (ref 0.0–0.7)
Eosinophils Relative: 2 % (ref 0–5)
Lymphs Abs: 1.4 10*3/uL (ref 0.7–4.0)
MCH: 26.5 pg (ref 26.0–34.0)
MCHC: 33.3 g/dL (ref 30.0–36.0)
MCV: 79.4 fL (ref 78.0–100.0)
Monocytes Absolute: 0.3 10*3/uL (ref 0.1–1.0)
Neutro Abs: 5.6 10*3/uL (ref 1.7–7.7)
Neutrophils Relative %: 76 % (ref 43–77)
Platelets: 166 10*3/uL (ref 150–400)
RBC: 4.57 MIL/uL (ref 3.87–5.11)
RDW: 12.9 % (ref 11.5–15.5)

## 2013-01-03 LAB — COMPREHENSIVE METABOLIC PANEL
ALT: <5 U/L (ref 0–35)
Albumin: 3.9 g/dL (ref 3.5–5.2)
Alkaline Phosphatase: 45 U/L (ref 39–117)
Calcium: 8.9 mg/dL (ref 8.4–10.5)
GFR calc Af Amer: >90 mL/min (ref >90)
Glucose, Bld: 104 mg/dL — ABNORMAL HIGH (ref 70–99)
Potassium: 3.6 mEq/L (ref 3.5–5.1)
Sodium: 139 mEq/L (ref 135–145)
Total Protein: 7.2 g/dL (ref 6.0–8.3)

## 2013-01-03 LAB — URINE MICROSCOPIC-ADD ON

## 2013-01-03 MED ORDER — MORPHINE SULFATE 2 MG/ML IJ SOLN
2.0000 mg | Freq: Once | INTRAMUSCULAR | Status: AC
  Administered 2013-01-03: 2 mg via INTRAVENOUS
  Filled 2013-01-03: qty 1

## 2013-01-03 MED ORDER — KETOROLAC TROMETHAMINE 15 MG/ML IJ SOLN
15.0000 mg | Freq: Once | INTRAMUSCULAR | Status: AC
  Administered 2013-01-03: 15 mg via INTRAVENOUS
  Filled 2013-01-03: qty 1

## 2013-01-03 MED ORDER — HYDROCODONE-ACETAMINOPHEN 5-325 MG PO TABS: 1.0000 | ORAL_TABLET | ORAL | Status: DC | PRN

## 2013-01-03 MED ORDER — SODIUM CHLORIDE 0.9 % IV SOLN
Freq: Once | INTRAVENOUS | Status: AC
  Administered 2013-01-03: 09:00:00 via INTRAVENOUS

## 2013-01-03 NOTE — ED Notes (Signed)
Pt has returned from being out of the department; pt placed back on continuous pulse oximetry and blood pressure cuff 

## 2013-01-03 NOTE — ED Notes (Signed)
Pelvic cart set up at bedside; pt up ambulatory at this time to attempt to provide an urine specimen

## 2013-01-03 NOTE — ED Notes (Signed)
Patient transported to Ultrasound 

## 2013-01-03 NOTE — ED Notes (Addendum)
Chaperoned and assisted Masneri, MD with pelvic examination and collection of vaginal specimens; sent samples to lab for testing

## 2013-01-03 NOTE — ED Notes (Signed)
C/o RLQ pain from ovarian cyst for months, is scheduled for surgery 02-21-13. States pain worsened 2 hours ago.

## 2013-01-03 NOTE — ED Notes (Signed)
Pt brought back from triage; pt getting undressed and into a gown at this time; family at bedside

## 2013-01-03 NOTE — ED Notes (Signed)
Pt d/c'd from IV, continuous pulse oximetry and blood pressure cuff; pt getting dressed to be discharged home; family at bedside 

## 2013-01-03 NOTE — ED Notes (Signed)
Reports prescribed ibuprofen for pain but is not helping. Last dose was 2 hours ago. Denies n/v/d, vaginal bleeding. States last period normal.

## 2013-01-03 NOTE — ED Notes (Signed)
Pt resting and family at bedside

## 2013-01-03 NOTE — ED Provider Notes (Signed)
CSN: [id number]     Arrival date & time 01/03/13  4098 History   First MD Initiated Contact with Patient 01/03/13 0809     Chief Complaint  Patient presents with  . Abdominal Pain   (Consider location/radiation/quality/duration/timing/severity/associated sxs/prior Treatment) HPI Comments: 47 yo Asian female with RLQ and R pelvic pain. Pt has h/o R ovarian cyst and is scheduled for surgery in January.    Pt's sister is an interpreter and assisted with obtaining history.  Pt speaks [place].   Pt denies recent f/c, infections, cp, cough, n/v/d, vaginal d/c or abnormal bleeding.  LMP 4NOV14.      Patient is a 47 y.o. female presenting with abdominal pain. The history is provided by the patient. The history is limited by a language barrier. A language interpreter was used.  Abdominal Pain Pain location:  RLQ Pain quality: aching and heavy   Pain radiates to:  Does not radiate Pain severity:  Moderate Onset quality:  Sudden Duration:  2 days Timing:  Constant Progression:  Worsening Chronicity:  Recurrent Context comment:  Pt has a known R ovarian cyst and is scheduled for a Surgery in January Relieved by:  NSAIDs Worsened by:  Nothing tried Associated symptoms: no cough, no diarrhea, no dysuria, no fever, no hematochezia, no hematuria, no nausea, no vaginal bleeding, no vaginal discharge and no vomiting   Associated symptoms comment:  LMP: 4NOV14 Risk factors: no alcohol abuse, no aspirin use, not elderly, has not had multiple surgeries and not pregnant     Past Medical History  Diagnosis Date  . GERD (gastroesophageal reflux disease)   . Ovarian cyst    History reviewed. No pertinent past surgical history. Family History  Problem Relation Age of Onset  . Asthma Father    History  Substance Use Topics  . Smoking status: Never Smoker   . Smokeless tobacco: Never Used  . Alcohol Use: No   OB History   Grav Para Term Preterm Abortions TAB SAB Ect Mult Living   2 2 2  0 0 0 0  0 0 2     Review of Systems  Constitutional: Negative.  Negative for fever.  HENT: Negative.   Eyes: Negative.   Respiratory: Negative.  Negative for cough.   Cardiovascular: Negative.   Gastrointestinal: Positive for abdominal pain. Negative for nausea, vomiting, diarrhea, hematochezia and abdominal distention.  Endocrine: Negative.   Genitourinary: Negative for dysuria, frequency, hematuria, flank pain, vaginal bleeding, vaginal discharge, enuresis and difficulty urinating.  Musculoskeletal: Negative.   Allergic/Immunologic: Negative.   Neurological: Negative.   Hematological: Negative.     Allergies  Review of patient's allergies indicates no known allergies.  Home Medications   Current Outpatient Rx  Name  Route  Sig  Dispense  Refill  . ibuprofen (ADVIL,MOTRIN) 600 MG tablet   Oral   Take 1 tablet (600 mg total) by mouth every 6 (six) hours as needed for pain.   30 tablet   1    BP 104/60  Pulse 73  Temp(Src) 98.2 F (36.8 C) (Oral)  Resp 17  Ht 5' (1.524 m)  Wt 120 lb (54.432 kg)  BMI 23.44 kg/m2  SpO2 99%  LMP 12/24/2012 Physical Exam  Nursing note and vitals reviewed. Constitutional: She is oriented to person, place, and time. She appears well-developed and well-nourished. No distress.  HENT:  Head: Normocephalic and atraumatic.  Eyes: Right eye exhibits no discharge. Left eye exhibits no discharge.  Neck: Normal range of motion. Neck supple.  Cardiovascular: Normal rate and regular rhythm.  Exam reveals no friction rub.   No murmur heard. Pulmonary/Chest: Effort normal and breath sounds normal. She has no wheezes. She has no rales.  Abdominal: Soft. Bowel sounds are normal. She exhibits no distension and no mass. There is tenderness. There is no rebound and no guarding. Hernia confirmed negative in the right inguinal area and confirmed negative in the left inguinal area.  Genitourinary: There is no rash or tenderness on the right labia. There is no rash  or tenderness on the left labia. Uterus is enlarged and tender. Cervix exhibits discharge. Cervix exhibits no motion tenderness and no friability. Right adnexum displays tenderness. Left adnexum displays tenderness. No erythema, tenderness or bleeding around the vagina. No foreign body around the vagina. Vaginal discharge found.  Musculoskeletal: Normal range of motion. She exhibits no edema and no tenderness.  Neurological: She is alert and oriented to person, place, and time. She has normal reflexes.  Skin: Skin is warm and dry. She is not diaphoretic.    ED Course  Procedures (including critical care time) Labs Review Labs Reviewed  WET PREP, GENITAL - Abnormal; Notable for the following:    WBC, Wet Prep HPF POC MODERATE (*)    All other components within normal limits  COMPREHENSIVE METABOLIC PANEL - Abnormal; Notable for the following:    Glucose, Bld 104 (*)    Total Bilirubin 0.2 (*)    All other components within normal limits  URINALYSIS, ROUTINE W REFLEX MICROSCOPIC - Abnormal; Notable for the following:    APPearance CLOUDY (*)    Leukocytes, UA SMALL (*)    All other components within normal limits  URINE MICROSCOPIC-ADD ON - Abnormal; Notable for the following:    Squamous Epithelial / LPF FEW (*)    All other components within normal limits  GC/CHLAMYDIA PROBE AMP  CBC WITH DIFFERENTIAL  PREGNANCY, URINE   Imaging Review US Transvaginal Non-ob  01/03/2013   CLINICAL DATA:  Pelvic pain, uterine enlargement.  EXAM: TRANSABDOMINAL AND TRANSVAGINAL ULTRASOUND OF PELVIS  TECHNIQUE: Both transabdominal and transvaginal ultrasound examinations of the pelvis were performed. Transabdominal technique was performed for global imaging of the pelvis including uterus, ovaries, adnexal regions, and pelvic cul-de-sac. It was necessary to proceed with endovaginal exam following the transabdominal exam to visualize the ovaries.  COMPARISON:  10/03/2012, CT abdomen 08/20/2012  FINDINGS:  Uterus  Measurements: 9.1 x 4.1 x 4.8cm. No fibroids or other mass visualized.  Endometrium  Thickness: 9.2 mm.  No focal abnormality visualized.  Right ovary  Measurements: 8.8 x 6.9 x 6 cm. There is a complex right ovarian cystic mass measuring 7.7 x 6 x 5.2 cm which is homogeneously hypoechoic. There is no internal Doppler flow. There is no mural nodule.  Left ovary  Measurements: 8 x 5.3 x 5.3 cm. There is a complex left ovarian cystic mass measuring 6.2 x 4.6 x 4.5 cm which is homogeneously in echotexture without internal Doppler flow or mural nodule.  Other findings  No free fluid.  IMPRESSION: Again noted are bilateral complex ovarian cystic masses without significant interval change compared with the prior ultrasound dated 10/03/2012. The overall appearance is most concerning for bilateral ovarian endometriomas.   Electronically Signed   By: Elige Ko   On: 01/03/2013 12:31   US Pelvis Complete  01/03/2013   CLINICAL DATA:  Pelvic pain, uterine enlargement.  EXAM: TRANSABDOMINAL AND TRANSVAGINAL ULTRASOUND OF PELVIS  TECHNIQUE: Both transabdominal and transvaginal ultrasound examinations of the  pelvis were performed. Transabdominal technique was performed for global imaging of the pelvis including uterus, ovaries, adnexal regions, and pelvic cul-de-sac. It was necessary to proceed with endovaginal exam following the transabdominal exam to visualize the ovaries.  COMPARISON:  10/03/2012, CT abdomen 08/20/2012  FINDINGS: Uterus  Measurements: 9.1 x 4.1 x 4.8cm. No fibroids or other mass visualized.  Endometrium  Thickness: 9.2 mm.  No focal abnormality visualized.  Right ovary  Measurements: 8.8 x 6.9 x 6 cm. There is a complex right ovarian cystic mass measuring 7.7 x 6 x 5.2 cm which is homogeneously hypoechoic. There is no internal Doppler flow. There is no mural nodule.  Left ovary  Measurements: 8 x 5.3 x 5.3 cm. There is a complex left ovarian cystic mass measuring 6.2 x 4.6 x 4.5 cm which is  homogeneously in echotexture without internal Doppler flow or mural nodule.  Other findings  No free fluid.  IMPRESSION: Again noted are bilateral complex ovarian cystic masses without significant interval change compared with the prior ultrasound dated 10/03/2012. The overall appearance is most concerning for bilateral ovarian endometriomas.   Electronically Signed   By: Elige Ko   On: 01/03/2013 12:31    EKG Interpretation   None      Results for orders placed during the hospital encounter of 01/03/13  WET PREP, GENITAL      Result Value Range   Yeast Wet Prep HPF POC NONE SEEN  NONE SEEN   Trich, Wet Prep NONE SEEN  NONE SEEN   Clue Cells Wet Prep HPF POC NONE SEEN  NONE SEEN   WBC, Wet Prep HPF POC MODERATE (*) NONE SEEN  CBC WITH DIFFERENTIAL      Result Value Range   WBC 7.4  4.0 - 10.5 K/uL   RBC 4.57  3.87 - 5.11 MIL/uL   Hemoglobin 12.1  12.0 - 15.0 g/dL   HCT 29.5  62.1 - 30.8 %   MCV 79.4  78.0 - 100.0 fL   MCH 26.5  26.0 - 34.0 pg   MCHC 33.3  30.0 - 36.0 g/dL   RDW 65.7  84.6 - 96.2 %   Platelets 166  150 - 400 K/uL   Neutrophils Relative % 76  43 - 77 %   Neutro Abs 5.6  1.7 - 7.7 K/uL   Lymphocytes Relative 19  12 - 46 %   Lymphs Abs 1.4  0.7 - 4.0 K/uL   Monocytes Relative 4  3 - 12 %   Monocytes Absolute 0.3  0.1 - 1.0 K/uL   Eosinophils Relative 2  0 - 5 %   Eosinophils Absolute 0.1  0.0 - 0.7 K/uL   Basophils Relative 0  0 - 1 %   Basophils Absolute 0.0  0.0 - 0.1 K/uL  COMPREHENSIVE METABOLIC PANEL      Result Value Range   Sodium 139  135 - 145 mEq/L   Potassium 3.6  3.5 - 5.1 mEq/L   Chloride 103  96 - 112 mEq/L   CO2 26  19 - 32 mEq/L   Glucose, Bld 104 (*) 70 - 99 mg/dL   BUN 12  6 - 23 mg/dL   Creatinine, Ser 9.52  0.50 - 1.10 mg/dL   Calcium 8.9  8.4 - 84.1 mg/dL   Total Protein 7.2  6.0 - 8.3 g/dL   Albumin 3.9  3.5 - 5.2 g/dL   AST 17  0 - 37 U/L   ALT <5  0 -  35 U/L   Alkaline Phosphatase 45  39 - 117 U/L   Total Bilirubin 0.2 (*)  0.3 - 1.2 mg/dL   GFR calc non Af Amer >90  >90 mL/min   GFR calc Af Amer >90  >90 mL/min  PREGNANCY, URINE      Result Value Range   Preg Test, Ur NEGATIVE  NEGATIVE  URINALYSIS, ROUTINE W REFLEX MICROSCOPIC      Result Value Range   Color, Urine YELLOW  YELLOW   APPearance CLOUDY (*) CLEAR   Specific Gravity, Urine 1.020  1.005 - 1.030   pH 6.5  5.0 - 8.0   Glucose, UA NEGATIVE  NEGATIVE mg/dL   Hgb urine dipstick NEGATIVE  NEGATIVE   Bilirubin Urine NEGATIVE  NEGATIVE   Ketones, ur NEGATIVE  NEGATIVE mg/dL   Protein, ur NEGATIVE  NEGATIVE mg/dL   Urobilinogen, UA 1.0  0.0 - 1.0 mg/dL   Nitrite NEGATIVE  NEGATIVE   Leukocytes, UA SMALL (*) NEGATIVE  URINE MICROSCOPIC-ADD ON      Result Value Range   Squamous Epithelial / LPF FEW (*) RARE   WBC, UA 0-2  <3 WBC/hpf   Bacteria, UA RARE  RARE   Urine-Other MUCOUS PRESENT      MDM   1. Ovarian cyst   2. Pelvic pain in female    47 year old Asian female presents emergency department with chief complaint of right lower pelvic pain. Symptoms have been going on for months per the patient and her sister who serves as an Equities trader. Patient is scheduled for surgery on 02/21/2013. Today she states her pain is significantly worsened. Physical exam reveals enlarged uterus which is tender to palpation. Lab work is unremarkable with the exception of white blood cells on wet prep. Symptoms significantly improved after IV medications. Patient is pending a pelvic ultrasound.  3:41 PM Prolonged length of stay in the emergency department pending completed ultrasound. Initial ultrasound performed without Doppler of bilateral ovaries, however ovarian torsion is in the differential diagnosis. I discussed obtaining duplex ultrasound of bilateral ovaries with radiology Dr. Allena Katz. Duplex order was placed and patient is waiting ultrasound. Vital signs are stable patient is in no acute distress.  Pt is aware GC/Chlam cultures are pending.    4:11  PM Doppler of Ovaries reveals no evidence of torsion.  Discharge patient home with pain medications. Recommend followup with her OB/GYN provider within the next few days to discuss expediting planned surgery. ER precautions given patient and her sister are aware of the plan and without questions at time of discharge.  Darlys Gales, MD 01/03/13 [phone]

## 2013-01-04 LAB — GC/CHLAMYDIA PROBE AMP
CT Probe RNA: NEGATIVE
GC Probe RNA: NEGATIVE

## 2013-01-09 ENCOUNTER — Encounter (HOSPITAL_COMMUNITY)
Admission: RE | Disposition: A | Discharge status: Home / Self Care | Payer: Self-pay | Source: Ambulatory Visit | Attending: Obstetrics & Gynecology

## 2013-01-09 ENCOUNTER — Encounter (HOSPITAL_COMMUNITY): Payer: Self-pay | Admitting: Pharmacist

## 2013-01-09 ENCOUNTER — Encounter (HOSPITAL_COMMUNITY): Payer: BC Managed Care – PPO | Admitting: Anesthesiology

## 2013-01-09 ENCOUNTER — Ambulatory Visit (HOSPITAL_COMMUNITY): Payer: BC Managed Care – PPO | Admitting: Anesthesiology

## 2013-01-09 ENCOUNTER — Encounter (HOSPITAL_COMMUNITY): Payer: Self-pay | Admitting: Anesthesiology

## 2013-01-09 ENCOUNTER — Ambulatory Visit (HOSPITAL_COMMUNITY)
Admission: RE | Admit: 2013-01-09 | Discharge: 2013-01-09 | Disposition: A | Discharge status: Home / Self Care | Payer: BC Managed Care – PPO | Source: Ambulatory Visit | Attending: Obstetrics & Gynecology | Admitting: Obstetrics & Gynecology

## 2013-01-09 DIAGNOSIS — R102 Pelvic and perineal pain: Secondary | ICD-10-CM

## 2013-01-09 DIAGNOSIS — N803 Endometriosis of pelvic peritoneum, unspecified: Secondary | ICD-10-CM | POA: Insufficient documentation

## 2013-01-09 DIAGNOSIS — N949 Unspecified condition associated with female genital organs and menstrual cycle: Secondary | ICD-10-CM | POA: Insufficient documentation

## 2013-01-09 DIAGNOSIS — N839 Noninflammatory disorder of ovary, fallopian tube and broad ligament, unspecified: Secondary | ICD-10-CM | POA: Insufficient documentation

## 2013-01-09 DIAGNOSIS — IMO0002 Reserved for concepts with insufficient information to code with codable children: Secondary | ICD-10-CM

## 2013-01-09 HISTORY — PX: LAPAROSCOPY: SHX197

## 2013-01-09 LAB — CBC
Hemoglobin: 12.4 g/dL (ref 12.0–15.0)
MCH: 26.3 pg (ref 26.0–34.0)
MCHC: 34 g/dL (ref 30.0–36.0)
MCV: 77.3 fL — ABNORMAL LOW (ref 78.0–100.0)
Platelets: 181 10*3/uL (ref 150–400)

## 2013-01-09 SURGERY — LAPAROSCOPY, DIAGNOSTIC
Anesthesia: General | Site: Abdomen | Wound class: Clean

## 2013-01-09 MED ORDER — NEOSTIGMINE METHYLSULFATE 1 MG/ML IJ SOLN
INTRAMUSCULAR | Status: DC | PRN
  Administered 2013-01-09: 3 mg via INTRAVENOUS

## 2013-01-09 MED ORDER — FENTANYL CITRATE 0.05 MG/ML IJ SOLN
INTRAMUSCULAR | Status: AC
  Filled 2013-01-09: qty 5

## 2013-01-09 MED ORDER — PROPOFOL 10 MG/ML IV EMUL
INTRAVENOUS | Status: AC
  Filled 2013-01-09: qty 20

## 2013-01-09 MED ORDER — OXYCODONE-ACETAMINOPHEN 5-325 MG PO TABS: 1.0000 | ORAL_TABLET | Freq: Four times a day (QID) | ORAL | Status: DC | PRN

## 2013-01-09 MED ORDER — ONDANSETRON HCL 4 MG/2ML IJ SOLN
INTRAMUSCULAR | Status: AC
  Filled 2013-01-09: qty 2

## 2013-01-09 MED ORDER — PROMETHAZINE HCL 25 MG/ML IJ SOLN: 6.2500 mg | INTRAMUSCULAR | Status: DC | PRN

## 2013-01-09 MED ORDER — MIDAZOLAM HCL 2 MG/2ML IJ SOLN
INTRAMUSCULAR | Status: DC | PRN
  Administered 2013-01-09: 2 mg via INTRAVENOUS

## 2013-01-09 MED ORDER — MEPERIDINE HCL 25 MG/ML IJ SOLN: 6.2500 mg | INTRAMUSCULAR | Status: DC | PRN

## 2013-01-09 MED ORDER — NEOSTIGMINE METHYLSULFATE 1 MG/ML IJ SOLN
INTRAMUSCULAR | Status: AC
  Filled 2013-01-09: qty 1

## 2013-01-09 MED ORDER — OXYCODONE-ACETAMINOPHEN 5-325 MG PO TABS
ORAL_TABLET | ORAL | Status: AC
  Filled 2013-01-09: qty 1

## 2013-01-09 MED ORDER — MIDAZOLAM HCL 2 MG/2ML IJ SOLN
INTRAMUSCULAR | Status: AC
  Filled 2013-01-09: qty 2

## 2013-01-09 MED ORDER — KETOROLAC TROMETHAMINE 30 MG/ML IJ SOLN
INTRAMUSCULAR | Status: DC | PRN
  Administered 2013-01-09: 30 mg via INTRAVENOUS

## 2013-01-09 MED ORDER — DEXAMETHASONE SODIUM PHOSPHATE 10 MG/ML IJ SOLN
INTRAMUSCULAR | Status: DC | PRN
  Administered 2013-01-09: 10 mg via INTRAVENOUS

## 2013-01-09 MED ORDER — DEXAMETHASONE SODIUM PHOSPHATE 10 MG/ML IJ SOLN
INTRAMUSCULAR | Status: AC
  Filled 2013-01-09: qty 1

## 2013-01-09 MED ORDER — GLYCOPYRROLATE 0.2 MG/ML IJ SOLN
INTRAMUSCULAR | Status: DC | PRN
  Administered 2013-01-09: 0.6 mg via INTRAVENOUS

## 2013-01-09 MED ORDER — HYDROMORPHONE HCL PF 1 MG/ML IJ SOLN: 0.2500 mg | INTRAMUSCULAR | Status: DC | PRN

## 2013-01-09 MED ORDER — BUPIVACAINE HCL 0.25 % IJ SOLN
INTRAMUSCULAR | Status: DC | PRN
  Administered 2013-01-09: 6 mL

## 2013-01-09 MED ORDER — KETOROLAC TROMETHAMINE 30 MG/ML IJ SOLN
INTRAMUSCULAR | Status: AC
  Filled 2013-01-09: qty 1

## 2013-01-09 MED ORDER — PROPOFOL 10 MG/ML IV BOLUS
INTRAVENOUS | Status: DC | PRN
  Administered 2013-01-09: 20 mg via INTRAVENOUS
  Administered 2013-01-09: 180 mg via INTRAVENOUS

## 2013-01-09 MED ORDER — LACTATED RINGERS IV SOLN
INTRAVENOUS | Status: DC
  Administered 2013-01-09 (×3): via INTRAVENOUS

## 2013-01-09 MED ORDER — ROCURONIUM BROMIDE 100 MG/10ML IV SOLN
INTRAVENOUS | Status: DC | PRN
  Administered 2013-01-09: 10 mg via INTRAVENOUS
  Administered 2013-01-09: 40 mg via INTRAVENOUS

## 2013-01-09 MED ORDER — LIDOCAINE HCL (CARDIAC) 20 MG/ML IV SOLN
INTRAVENOUS | Status: AC
  Filled 2013-01-09: qty 5

## 2013-01-09 MED ORDER — GLYCOPYRROLATE 0.2 MG/ML IJ SOLN
INTRAMUSCULAR | Status: AC
  Filled 2013-01-09: qty 3

## 2013-01-09 MED ORDER — LIDOCAINE HCL (CARDIAC) 20 MG/ML IV SOLN
INTRAVENOUS | Status: DC | PRN
  Administered 2013-01-09: 50 mg via INTRAVENOUS

## 2013-01-09 MED ORDER — ROCURONIUM BROMIDE 100 MG/10ML IV SOLN
INTRAVENOUS | Status: AC
  Filled 2013-01-09: qty 1

## 2013-01-09 MED ORDER — SODIUM CHLORIDE 0.9 % IJ SOLN
INTRAMUSCULAR | Status: AC
  Filled 2013-01-09: qty 10

## 2013-01-09 MED ORDER — GLYCOPYRROLATE 0.2 MG/ML IJ SOLN
INTRAMUSCULAR | Status: AC
  Filled 2013-01-09: qty 2

## 2013-01-09 MED ORDER — FENTANYL CITRATE 0.05 MG/ML IJ SOLN
INTRAMUSCULAR | Status: DC | PRN
  Administered 2013-01-09 (×2): 100 ug via INTRAVENOUS

## 2013-01-09 MED ORDER — ONDANSETRON HCL 4 MG/2ML IJ SOLN
INTRAMUSCULAR | Status: DC | PRN
  Administered 2013-01-09: 4 mg via INTRAVENOUS

## 2013-01-09 MED ORDER — OXYCODONE-ACETAMINOPHEN 5-325 MG PO TABS
1.0000 | ORAL_TABLET | ORAL | Status: DC | PRN
  Administered 2013-01-09: 1 via ORAL

## 2013-01-09 MED ORDER — BUPIVACAINE HCL (PF) 0.25 % IJ SOLN
INTRAMUSCULAR | Status: AC
  Filled 2013-01-09: qty 30

## 2013-01-09 SURGICAL SUPPLY — 17 items
CHLORAPREP W/TINT 26ML (MISCELLANEOUS) ×2 IMPLANT
CLOTH BEACON ORANGE TIMEOUT ST (SAFETY) ×2 IMPLANT
DRSG COVADERM PLUS 2X2 (GAUZE/BANDAGES/DRESSINGS) ×2 IMPLANT
GLOVE BIO SURGEON STRL SZ 6.5 (GLOVE) ×2 IMPLANT
GLOVE BIOGEL PI IND STRL 7.0 (GLOVE) ×1 IMPLANT
GLOVE BIOGEL PI INDICATOR 7.0 (GLOVE) ×1
GOWN PREVENTION PLUS LG XLONG (DISPOSABLE) ×6 IMPLANT
NEEDLE INSUFFLATION 120MM (ENDOMECHANICALS) ×2 IMPLANT
PACK LAPAROSCOPY BASIN (CUSTOM PROCEDURE TRAY) ×2 IMPLANT
PROTECTOR NERVE ULNAR (MISCELLANEOUS) ×4 IMPLANT
SUT VICRYL 0 UR6 27IN ABS (SUTURE) ×2 IMPLANT
SUT VICRYL 4-0 PS2 18IN ABS (SUTURE) ×2 IMPLANT
TOWEL OR 17X24 6PK STRL BLUE (TOWEL DISPOSABLE) ×2 IMPLANT
TRAY FOLEY CATH 14FR (SET/KITS/TRAYS/PACK) ×2 IMPLANT
TROCAR XCEL DIL TIP R 11M (ENDOMECHANICALS) ×2 IMPLANT
TROCAR XCEL NON-BLD 5MMX100MML (ENDOMECHANICALS) ×2 IMPLANT
WARMER LAPAROSCOPE (MISCELLANEOUS) ×2 IMPLANT

## 2013-01-09 NOTE — Op Note (Signed)
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 Scheryl Darter Asst.: Dr. Tinnie Gens Anesthesia: Gen. Complications: None Specimens: None Drains: None Counts: Correct  Patient gave written consent for laparoscopy removal of ovarian cyst and possible oophorectomy do to pelvic pain and diagnosis of bilateral pelvic masses, possible endometriomas. Patient identification was confirmed she was brought to the operating room and general anesthesia was induced. She's placed in dorsal lithotomy position. Abdomen perineum and vagina were sterilely prepped and draped. A Hulka tenaculum was placed on the cervix. Foley catheter was placed. #11 blade was used to make a vertical infraumbilical skin incision was made 1-1/2 cm. Abdominal wall was elevated Veress needle was placed and CO2 was insufflated. The needle was removed and an 11 mm trocar was placed. Laparoscope was inserted and video camera was in use. There was extensive this pelvic endometriosis with evidence of adhesions and endometriotic implants in the upper abdomen anterior to the liver. There appeared to be a right ovarian mass with bowel adhesions. Normal ovarian tissue was not visualized. This the extensive nature of adhesions masses and endometriotic implants elected to not proceed with any excision. Candidate tears the Lupron and prepare for possible hysterectomy. Instruments removed and CO2 was released. 0 Vicryl suture was placed the fascia at the umbilicus. Skin was closed with interrupted subcuticular sutures with 4-0 Vicryl in a sterile dressing was applied. Patient tolerated procedure well without complication. She is brought in stable condition to PACU.   Dr. Scheryl Darter 01/09/2013 5:13 PM

## 2013-01-09 NOTE — Transfer of Care (Signed)
Immediate Anesthesia Transfer of Care Note  Patient: Megan Ford  Procedure(s) Performed: Procedure(s): LAPAROSCOPY DIAGNOSTIC (N/A)  Patient Location: PACU  Anesthesia Type:General  Level of Consciousness: awake  Airway & Oxygen Therapy: Patient Spontanous Breathing  Post-op Assessment: Report given to PACU RN  Post vital signs: stable  Filed Vitals:   01/09/13 1334  BP: 129/74  Pulse: 76  Temp: 36.7 C  Resp: 16    Complications: No apparent anesthesia complications

## 2013-01-09 NOTE — Anesthesia Preprocedure Evaluation (Signed)
Anesthesia Evaluation  Patient identified by MRN, date of birth, ID band Patient awake    Reviewed: Allergy & Precautions, H&P , NPO status , Patient's Chart, lab work & pertinent test results  Airway Mallampati: II TM Distance: >3 FB Neck ROM: Full    Dental no notable dental hx. (+) Teeth Intact   Pulmonary neg pulmonary ROS,  breath sounds clear to auscultation  Pulmonary exam normal       Cardiovascular negative cardio ROS  Rhythm:Regular Rate:Normal     Neuro/Psych negative neurological ROS  negative psych ROS   GI/Hepatic Neg liver ROS, GERD-  Medicated and Controlled,  Endo/Other  negative endocrine ROS  Renal/GU negative Renal ROS   dysuria negative genitourinary   Musculoskeletal negative musculoskeletal ROS (+)   Abdominal   Peds  Hematology negative hematology ROS (+)   Anesthesia Other Findings   Reproductive/Obstetrics Pelvic Pain  Endometrioma                            Anesthesia Physical Anesthesia Plan  ASA: II  Anesthesia Plan: General   Post-op Pain Management:    Induction: Intravenous and Cricoid pressure planned  Airway Management Planned: Oral ETT  Additional Equipment:   Intra-op Plan:   Post-operative Plan: Extubation in OR  Informed Consent: I have reviewed the patients History and Physical, chart, labs and discussed the procedure including the risks, benefits and alternatives for the proposed anesthesia with the patient or authorized representative who has indicated his/her understanding and acceptance.   Dental advisory given  Plan Discussed with: Anesthesiologist, CRNA and Surgeon  Anesthesia Plan Comments:         Anesthesia Quick Evaluation

## 2013-01-09 NOTE — H&P (Signed)
Megan Phenix, MD Physician Signed  Progress Notes Service date: 12/20/2012 11:22 AM  Related encounter: Office Visit from 12/20/2012 in [facility]  Patient ID: Megan Ford, female   DOB: 1966-02-05, 47 y.o.   MRN: [id number]    Chief Complaint   Patient presents with   .  Follow-up      HPI Megan Ford is a 47 y.o. female.  [id number] Patient's last menstrual period was 11/25/2012. Still with bilateral pelvic pain. F/U US was missed although she says it was done.  HPI    Past Medical History   Diagnosis  Date   .  GERD (gastroesophageal reflux disease)        History reviewed. No pertinent past surgical history.    Family History   Problem  Relation  Age of Onset   .  Asthma  Father        Social History History   Substance Use Topics   .  Smoking status:  Never Smoker    .  Smokeless tobacco:  Never Used   .  Alcohol Use:  No      No Known Allergies    Current Outpatient Prescriptions   Medication  Sig  Dispense  Refill   .  ibuprofen (ADVIL,MOTRIN) 600 MG tablet  Take 1 tablet (600 mg total) by mouth every 6 (six) hours as needed for pain.   30 tablet   1   .  ranitidine (ZANTAC) 300 MG tablet  Take 300 mg by mouth 2 (two) times daily.          Marland Kitchen  omeprazole (PRILOSEC) 40 MG capsule  Take 40 mg by mouth daily.             No current facility-administered medications for this visit.      Review of Systems Review of Systems  Genitourinary: Positive for dysuria and pelvic pain. Negative for vaginal discharge and menstrual problem.    Blood pressure 129/83, pulse 68, height 5' (1.524 m), weight 127 lb 3.2 oz (57.698 kg), last menstrual period 11/25/2012.   Physical Exam Physical Exam  Constitutional: She appears well-developed. No distress.  Pulmonary/Chest: Effort normal. No respiratory distress.  Skin: Skin is warm and dry.  Psychiatric: She has a normal mood and affect. Her behavior is normal.    Data Reviewed [place] result     Assessment Bilateral endometriomas by [place]    Plan Laparoscopy, possible cystectomy, oophorectomy. Procedure and the risk of anesthesia, bleeding infection, visceral organ damage were discussed and her questions were answered via interpreter.        Dayvin Aber 12/20/2012, 11:22 AM

## 2013-01-09 NOTE — Anesthesia Postprocedure Evaluation (Signed)
  Anesthesia Post-op Note  Patient: Megan Ford  Procedure(s) Performed: Procedure(s): LAPAROSCOPY DIAGNOSTIC (N/A)  Patient Location: PACU  Anesthesia Type:General  Level of Consciousness: awake, alert  and oriented  Airway and Oxygen Therapy: Patient Spontanous Breathing  Post-op Pain: mild  Post-op Assessment: Post-op Vital signs reviewed, Patient's Cardiovascular Status Stable, Respiratory Function Stable, Patent Airway, No signs of Nausea or vomiting and Pain level controlled  Post-op Vital Signs: Reviewed and stable  Complications: No apparent anesthesia complications

## 2013-01-10 ENCOUNTER — Telehealth: Payer: Self-pay

## 2013-01-10 ENCOUNTER — Encounter (HOSPITAL_COMMUNITY): Payer: Self-pay | Admitting: Obstetrics & Gynecology

## 2013-01-10 NOTE — Telephone Encounter (Signed)
Pt. Has insurance and therefore will be able to get lupron that we have in stock here. [employer] called and unable to get an interpreter for [employer] Pt. Needs to be scheduled for  nurse appointment for injection next week (24-26) if possible per Dr. Olivia Mackie request.

## 2013-01-10 NOTE — Telephone Encounter (Signed)
Message copied by Louanna Raw on Fri Jan 10, 2013 12:25 PM ------      Message from: Adam Phenix      Created: Thu Jan 09, 2013  5:21 PM       Can we get lupron 11.25 mg for this patient next week? ------

## 2013-01-14 NOTE — Telephone Encounter (Signed)
Attempted to call pt. With [employer]; [employer] stated they could not find an interpreter at this time. Will send letter.

## 2013-01-15 ENCOUNTER — Ambulatory Visit (INDEPENDENT_AMBULATORY_CARE_PROVIDER_SITE_OTHER): Payer: BC Managed Care – PPO | Admitting: *Deleted

## 2013-01-15 VITALS — BP 123/78 | HR 76 | Wt 122.2 lb

## 2013-01-15 DIAGNOSIS — Z3049 Encounter for surveillance of other contraceptives: Secondary | ICD-10-CM

## 2013-01-15 DIAGNOSIS — N803 Endometriosis of pelvic peritoneum: Secondary | ICD-10-CM

## 2013-01-15 DIAGNOSIS — Z23 Encounter for immunization: Secondary | ICD-10-CM

## 2013-01-15 MED ORDER — LEUPROLIDE ACETATE (3 MONTH) 11.25 MG IM KIT
11.2500 mg | PACK | Freq: Once | INTRAMUSCULAR | Status: AC
  Administered 2013-01-15: 11.25 mg via INTRAMUSCULAR

## 2013-01-29 ENCOUNTER — Telehealth: Payer: Self-pay | Admitting: General Practice

## 2013-01-29 ENCOUNTER — Encounter: Payer: Self-pay | Admitting: Obstetrics & Gynecology

## 2013-01-29 ENCOUNTER — Ambulatory Visit (INDEPENDENT_AMBULATORY_CARE_PROVIDER_SITE_OTHER): Payer: BC Managed Care – PPO | Admitting: Obstetrics & Gynecology

## 2013-01-29 VITALS — BP 113/72 | HR 75 | Ht 60.0 in | Wt 124.0 lb

## 2013-01-29 DIAGNOSIS — N803 Endometriosis of pelvic peritoneum: Secondary | ICD-10-CM

## 2013-01-29 DIAGNOSIS — Z09 Encounter for follow-up examination after completed treatment for conditions other than malignant neoplasm: Secondary | ICD-10-CM

## 2013-01-29 MED ORDER — LEUPROLIDE ACETATE (3 MONTH) 11.25 MG IM KIT: 11.2500 mg | PACK | Freq: Once | INTRAMUSCULAR | Status: DC

## 2013-01-29 NOTE — Patient Instructions (Signed)
Endometriosis Endometriosis is a disease that occurs when the endometrium (lining of the uterus) is misplaced outside of its normal location. It may occur in many locations close to the uterus (womb), but commonly on the ovaries, fallopian tubes, vagina (birth canal) and bowel located close to the uterus. Because the uterus sloughs (expels) its lining every month (menses), there is bleeding whereever the endometrial tissue is located. SYMPTOMS  Often there are no symptoms. However, because blood is irritating to tissues not normally exposed to it, when symptoms occur they vary with the location of the misplaced endometrium. Symptoms often include back and abdominal pain. Periods may be heavier and intercourse may be painful. Infertility may be present. You may have all of these symptoms at one time or another or you may have months with no symptoms at all. Although the symptoms occur mainly during menses, they can occur mid-cycle as well, and usually terminate with menopause. DIAGNOSIS  Your caregiver may recommend a blood test and urine test (urinalysis) to help rule out other conditions. Another common test is ultrasound, a painless procedure that uses sound waves to make a sonogram "picture" of abnormal tissue that could be endometriosis. If your bowel movements are painful around your periods, your caregiver may advise a barium enema (an X-ray of the lower bowel), to try to find the source of your pain. This is sometimes confirmed by laparoscopy. Laparoscopy is a procedure where your caregiver looks into your abdomen with a laparoscope (a small pencil sized telescope). Your caregiver may take a tiny piece of tissue (biopsy) from any abnormal tissue to confirm or document your problem. These tissues are sent to the lab and a pathologist looks at them under the microscope to give a microscopic diagnosis. TREATMENT  Once the diagnosis is made, it can be treated by destruction of the misplaced endometrial  tissue using heat (diathermy), laser, cutting (excision), or chemical means. It may also be treated with hormonal therapy. When using hormonal therapy menses are eliminated, therefore eliminating the monthly exposure to blood by the misplaced endometrial tissue. Only in severe cases is it necessary to perform a hysterectomy with removal of the tubes, uterus and ovaries. HOME CARE INSTRUCTIONS   Only take over-the-counter or prescription medicines for pain, discomfort, or fever as directed by your caregiver.  Avoid activities that produce pain, including a physical sexual relationship.  Do not take aspirin as this may increase bleeding when not on hormonal therapy.  See your caregiver for pain or problems not controlled with treatment. SEEK IMMEDIATE MEDICAL CARE IF:   Your pain is severe and is not responding to pain medicine.  You develop severe nausea and vomiting, or you cannot keep foods down.  Your pain localizes to the right lower part of your abdomen (possible appendicitis).  You have swelling or increasing pain in the abdomen.  You have a fever.  You see blood in your stool. MAKE SURE YOU:   Understand these instructions.  Will watch your condition.  Will get help right away if you are not doing well or get worse. Document Released: 02/04/2000 Document Revised: 05/01/2011 Document Reviewed: 10/04/2012 ExitCare Patient Information 2014 [employer].  

## 2013-01-29 NOTE — Telephone Encounter (Signed)
Referral made to Dr April Manson office in [place] at [facility]. Phone number to office [phone]. Patient has appt for January 12, Monday at 10am. Patient needs to be informed of appt, location and that she will have to fill out the form they mail to her and mail it back. Also patient needs to bring her own interpreter to the appt. Called [employer] and no one was available to interpret

## 2013-01-29 NOTE — Progress Notes (Signed)
   Subjective:    Patient ID: Megan Ford, female    DOB: May 29, 1965, 47 y.o.   MRN: [id number]  HPI [id number] Patient's last menstrual period was 01/23/2013. Still some low abdominal pain. I explained the findings at laparoscopy, severe endometriosis. We only did diagnostic procedure, plan to refer her to Dr. April Manson for possible definitive surgery.    Review of Systems  Constitutional: Negative for fever.  Genitourinary: Positive for pelvic pain. Negative for vaginal bleeding.       Objective:   Physical Exam  Vitals reviewed. Constitutional: She appears well-developed. No distress.  Abdominal: There is no tenderness.  Umbilical incision well healing  Skin: Skin is warm and dry.  Psychiatric: She has a normal mood and affect. Her behavior is normal.          Assessment & Plan:  Severe endometriosis, Lupron 11.25 mg, referral.  Adam Phenix, MD 01/29/2013

## 2013-01-31 ENCOUNTER — Encounter: Payer: Self-pay | Admitting: *Deleted

## 2013-02-03 NOTE — Telephone Encounter (Signed)
Called patient and she asked me to speak to her husband. Informed husband of appt time and location and phone number and that she will need to fill out the form they mail to her and mail it back in and she will have to bring someone with her to interpret that appt. Patient's husband verbalized understanding and had no further questions

## 2013-02-21 SURGERY — Surgical Case
Anesthesia: *Unknown

## 2013-06-03 ENCOUNTER — Other Ambulatory Visit: Payer: Self-pay | Admitting: Orthopedic Surgery

## 2013-06-05 ENCOUNTER — Encounter (HOSPITAL_BASED_OUTPATIENT_CLINIC_OR_DEPARTMENT_OTHER): Payer: Self-pay | Admitting: *Deleted

## 2013-06-05 NOTE — Progress Notes (Signed)
Talked with husband -[employer] could not find a [place] interpreter for call-he speaks english fairly well Did request interpreter for surgery

## 2013-06-09 NOTE — H&P (Signed)
  Megan Ford is an 48 y.o. female.   Chief Complaint: c/o chronic and progressive numbness and tingling of the left hand HPI: Megan Ford is a 48 year old right hand dominant [place] woman who is accompanied by her sister who is an excellent [place] [employer] nurse. She presents for evaluation of bilateral severe hand numbness with burning discomfort. She has had symptoms for one year. She has difficulty sleeping at night due to her hand numbness. She now presents for a possible evaluation of carpal tunnel syndrome. She states that her mother and sister have experienced similar predicaments.     Past Medical History  Diagnosis Date  . GERD (gastroesophageal reflux disease)   . Ovarian cyst     Past Surgical History  Procedure Laterality Date  . Laparoscopy N/A 01/09/2013    Procedure: LAPAROSCOPY DIAGNOSTIC;  Surgeon: Woodroe Mode, MD;  Location: [facility];  Service: Gynecology;  Laterality: N/A;    Family History  Problem Relation Age of Onset  . Asthma Father    Social History:  reports that she has never smoked. She has never used smokeless tobacco. She reports that she does not drink alcohol or use illicit drugs.  Allergies: No Known Allergies  No prescriptions prior to admission    No results found for this or any previous visit (from the past 48 hour(s)).  No results found.   Pertinent items are noted in HPI.  Height 5' (1.524 m), weight 56.246 kg (124 lb).  General appearance: alert Head: Normocephalic, without obvious abnormality Neck: supple, symmetrical, trachea midline Resp: clear to auscultation bilaterally Cardio: regular rate and rhythm GI: normal findings: bowel sounds normal Extremities:  Inspection of her hands reveals swelling over the ulnar bursa bilaterally. She has full ROM of her fingers in flexion/extension. Her sweat patterns and dermatoglyphics are preserved. Her pulses and capillary refill are intact. She has diminished sensibility  in the median nerve distribution. She has a positive Tinel's sign over the median nerve bilaterally. Her wrist flexion is uncomfortable reproducing numbness and a report through the translator of increased numbness.  Plain x-rays of her hands demonstrate no significant osteoarthritis or carpal dissociative instability.   We asked Dr. Zebedee Iba to complete detailed electrodiagnostic studies. These confirm moderately severe bilateral carpal tunnel syndrome with marked decrease in the sensory amplitudes bilaterally.   Pulses: 2+ and symmetric Skin: normal Neurologic: Grossly normal    Assessment/Plan Impression: Left CTS  Plan: To the OR for left CTR.The procedure, risks,benefits and post-op course were discussed with the patient at length and they were in agreement with the plan.  Megan Ford 06/09/2013, 12:14 PM   H&P documentation: 06/10/2013  -History and Physical Reviewed  -Patient has been re-examined  -No change in the plan of care  Cammie Sickle, MD

## 2013-06-10 ENCOUNTER — Ambulatory Visit (HOSPITAL_BASED_OUTPATIENT_CLINIC_OR_DEPARTMENT_OTHER)
Admission: RE | Admit: 2013-06-10 | Discharge: 2013-06-10 | Disposition: A | Discharge status: Home / Self Care | Payer: BC Managed Care – PPO | Source: Ambulatory Visit | Attending: Orthopedic Surgery | Admitting: Orthopedic Surgery

## 2013-06-10 ENCOUNTER — Ambulatory Visit (HOSPITAL_BASED_OUTPATIENT_CLINIC_OR_DEPARTMENT_OTHER): Payer: BC Managed Care – PPO | Admitting: Anesthesiology

## 2013-06-10 ENCOUNTER — Encounter (HOSPITAL_BASED_OUTPATIENT_CLINIC_OR_DEPARTMENT_OTHER)
Admission: RE | Disposition: A | Discharge status: Home / Self Care | Payer: Self-pay | Source: Ambulatory Visit | Attending: Orthopedic Surgery

## 2013-06-10 ENCOUNTER — Encounter (HOSPITAL_BASED_OUTPATIENT_CLINIC_OR_DEPARTMENT_OTHER): Payer: BC Managed Care – PPO | Admitting: Anesthesiology

## 2013-06-10 ENCOUNTER — Encounter (HOSPITAL_BASED_OUTPATIENT_CLINIC_OR_DEPARTMENT_OTHER): Payer: Self-pay

## 2013-06-10 DIAGNOSIS — N83209 Unspecified ovarian cyst, unspecified side: Secondary | ICD-10-CM | POA: Insufficient documentation

## 2013-06-10 DIAGNOSIS — K219 Gastro-esophageal reflux disease without esophagitis: Secondary | ICD-10-CM | POA: Insufficient documentation

## 2013-06-10 DIAGNOSIS — G5600 Carpal tunnel syndrome, unspecified upper limb: Secondary | ICD-10-CM | POA: Insufficient documentation

## 2013-06-10 DIAGNOSIS — N809 Endometriosis, unspecified: Secondary | ICD-10-CM | POA: Insufficient documentation

## 2013-06-10 HISTORY — PX: CARPAL TUNNEL RELEASE: SHX101

## 2013-06-10 LAB — POCT HEMOGLOBIN-HEMACUE: HEMOGLOBIN: 12.9 g/dL (ref 12.0–15.0)

## 2013-06-10 SURGERY — CARPAL TUNNEL RELEASE
Anesthesia: General | Site: Wrist | Laterality: Left

## 2013-06-10 MED ORDER — PROPOFOL 10 MG/ML IV BOLUS
INTRAVENOUS | Status: DC | PRN
  Administered 2013-06-10: 100 mg via INTRAVENOUS

## 2013-06-10 MED ORDER — CEFAZOLIN SODIUM-DEXTROSE 2-3 GM-% IV SOLR: 2.0000 g | INTRAVENOUS | Status: DC

## 2013-06-10 MED ORDER — LACTATED RINGERS IV SOLN
INTRAVENOUS | Status: DC
  Administered 2013-06-10: 08:00:00 via INTRAVENOUS
  Administered 2013-06-10: 10 mL/h via INTRAVENOUS

## 2013-06-10 MED ORDER — OXYCODONE HCL 5 MG PO TABS
5.0000 mg | ORAL_TABLET | Freq: Once | ORAL | Status: AC | PRN
  Administered 2013-06-10: 5 mg via ORAL

## 2013-06-10 MED ORDER — ACETAMINOPHEN 325 MG PO TABS: 325.0000 mg | ORAL_TABLET | ORAL | Status: DC | PRN

## 2013-06-10 MED ORDER — LIDOCAINE HCL 2 % IJ SOLN
INTRAMUSCULAR | Status: AC
  Filled 2013-06-10: qty 20

## 2013-06-10 MED ORDER — OXYCODONE HCL 5 MG PO TABS
ORAL_TABLET | ORAL | Status: AC
  Filled 2013-06-10: qty 1

## 2013-06-10 MED ORDER — ACETAMINOPHEN-CODEINE #3 300-30 MG PO TABS: 1.0000 | ORAL_TABLET | ORAL | Status: DC | PRN

## 2013-06-10 MED ORDER — KETOROLAC TROMETHAMINE 30 MG/ML IJ SOLN: 15.0000 mg | Freq: Once | INTRAMUSCULAR | Status: DC | PRN

## 2013-06-10 MED ORDER — LIDOCAINE HCL 2 % IJ SOLN
INTRAMUSCULAR | Status: DC | PRN
  Administered 2013-06-10: 2 mL

## 2013-06-10 MED ORDER — METHYLPREDNISOLONE ACETATE 40 MG/ML IJ SUSP
INTRAMUSCULAR | Status: AC
  Filled 2013-06-10: qty 1

## 2013-06-10 MED ORDER — FENTANYL CITRATE 0.05 MG/ML IJ SOLN
INTRAMUSCULAR | Status: DC | PRN
  Administered 2013-06-10: 50 ug via INTRAVENOUS

## 2013-06-10 MED ORDER — OXYCODONE HCL 5 MG/5ML PO SOLN: 5.0000 mg | Freq: Once | ORAL | Status: AC | PRN

## 2013-06-10 MED ORDER — FENTANYL CITRATE 0.05 MG/ML IJ SOLN
INTRAMUSCULAR | Status: AC
  Filled 2013-06-10: qty 2

## 2013-06-10 MED ORDER — MIDAZOLAM HCL 2 MG/2ML IJ SOLN: 1.0000 mg | INTRAMUSCULAR | Status: DC | PRN

## 2013-06-10 MED ORDER — MIDAZOLAM HCL 2 MG/2ML IJ SOLN
INTRAMUSCULAR | Status: AC
Start: 2013-06-10 — End: 2013-06-10
  Filled 2013-06-10: qty 2

## 2013-06-10 MED ORDER — PROMETHAZINE HCL 25 MG/ML IJ SOLN: 6.2500 mg | INTRAMUSCULAR | Status: DC | PRN

## 2013-06-10 MED ORDER — FENTANYL CITRATE 0.05 MG/ML IJ SOLN
25.0000 ug | INTRAMUSCULAR | Status: DC | PRN
  Administered 2013-06-10: 25 ug via INTRAVENOUS

## 2013-06-10 MED ORDER — LIDOCAINE HCL (CARDIAC) 20 MG/ML IV SOLN
INTRAVENOUS | Status: DC | PRN
  Administered 2013-06-10: 50 mg via INTRAVENOUS

## 2013-06-10 MED ORDER — DEXAMETHASONE SODIUM PHOSPHATE 4 MG/ML IJ SOLN
INTRAMUSCULAR | Status: DC | PRN
  Administered 2013-06-10: 10 mg via INTRAVENOUS

## 2013-06-10 MED ORDER — ACETAMINOPHEN 160 MG/5ML PO SOLN: 325.0000 mg | ORAL | Status: DC | PRN

## 2013-06-10 MED ORDER — MIDAZOLAM HCL 5 MG/5ML IJ SOLN
INTRAMUSCULAR | Status: DC | PRN
  Administered 2013-06-10: 1 mg via INTRAVENOUS

## 2013-06-10 MED ORDER — ONDANSETRON HCL 4 MG/2ML IJ SOLN
INTRAMUSCULAR | Status: DC | PRN
  Administered 2013-06-10: 4 mg via INTRAVENOUS

## 2013-06-10 MED ORDER — CHLORHEXIDINE GLUCONATE 4 % EX LIQD: 60.0000 mL | Freq: Once | CUTANEOUS | Status: DC

## 2013-06-10 MED ORDER — CEFAZOLIN SODIUM-DEXTROSE 2-3 GM-% IV SOLR
INTRAVENOUS | Status: AC
  Filled 2013-06-10: qty 50

## 2013-06-10 MED ORDER — FENTANYL CITRATE 0.05 MG/ML IJ SOLN: 50.0000 ug | INTRAMUSCULAR | Status: DC | PRN

## 2013-06-10 SURGICAL SUPPLY — 39 items
BANDAGE ADH SHEER 1  50/CT (GAUZE/BANDAGES/DRESSINGS) IMPLANT
BANDAGE COBAN STERILE 2 (GAUZE/BANDAGES/DRESSINGS) ×1 IMPLANT
BANDAGE ELASTIC 3 VELCRO ST LF (GAUZE/BANDAGES/DRESSINGS) ×1 IMPLANT
BLADE SURG 15 STRL LF DISP TIS (BLADE) ×1 IMPLANT
BLADE SURG 15 STRL SS (BLADE) ×2
BNDG CMPR 9X4 STRL LF SNTH (GAUZE/BANDAGES/DRESSINGS)
BNDG COHESIVE 3X5 TAN STRL LF (GAUZE/BANDAGES/DRESSINGS) IMPLANT
BNDG ESMARK 4X9 LF (GAUZE/BANDAGES/DRESSINGS) IMPLANT
BRUSH SCRUB EZ PLAIN DRY (MISCELLANEOUS) ×2 IMPLANT
CORDS BIPOLAR (ELECTRODE) ×1 IMPLANT
COVER MAYO STAND STRL (DRAPES) ×2 IMPLANT
COVER TABLE BACK 60X90 (DRAPES) ×2 IMPLANT
CUFF TOURNIQUET SINGLE 18IN (TOURNIQUET CUFF) IMPLANT
DECANTER SPIKE VIAL GLASS SM (MISCELLANEOUS) IMPLANT
DRAPE EXTREMITY T 121X128X90 (DRAPE) ×2 IMPLANT
DRAPE SURG 17X23 STRL (DRAPES) ×2 IMPLANT
GLOVE BIOGEL M STRL SZ7.5 (GLOVE) ×2 IMPLANT
GLOVE ORTHO TXT STRL SZ7.5 (GLOVE) ×2 IMPLANT
GOWN STRL REUS W/ TWL LRG LVL3 (GOWN DISPOSABLE) ×1 IMPLANT
GOWN STRL REUS W/ TWL XL LVL3 (GOWN DISPOSABLE) ×2 IMPLANT
GOWN STRL REUS W/TWL LRG LVL3 (GOWN DISPOSABLE) ×2
GOWN STRL REUS W/TWL XL LVL3 (GOWN DISPOSABLE) ×2
NEEDLE 27GAX1X1/2 (NEEDLE) ×1 IMPLANT
PACK BASIN DAY SURGERY FS (CUSTOM PROCEDURE TRAY) ×2 IMPLANT
PAD CAST 3X4 CTTN HI CHSV (CAST SUPPLIES) ×1 IMPLANT
PADDING CAST ABS 4INX4YD NS (CAST SUPPLIES) ×1
PADDING CAST ABS COTTON 4X4 ST (CAST SUPPLIES) ×1 IMPLANT
PADDING CAST COTTON 3X4 STRL (CAST SUPPLIES) ×2
SPLINT PLASTER CAST XFAST 3X15 (CAST SUPPLIES) ×5 IMPLANT
SPLINT PLASTER XTRA FASTSET 3X (CAST SUPPLIES) ×3
SPONGE GAUZE 4X4 12PLY (GAUZE/BANDAGES/DRESSINGS) ×2 IMPLANT
STOCKINETTE 4X48 STRL (DRAPES) ×2 IMPLANT
STRIP CLOSURE SKIN 1/2X4 (GAUZE/BANDAGES/DRESSINGS) ×2 IMPLANT
SUT PROLENE 3 0 PS 2 (SUTURE) ×2 IMPLANT
SYR 3ML 23GX1 SAFETY (SYRINGE) IMPLANT
SYR CONTROL 10ML LL (SYRINGE) ×1 IMPLANT
TOWEL OR 17X24 6PK STRL BLUE (TOWEL DISPOSABLE) ×2 IMPLANT
TRAY DSU PREP LF (CUSTOM PROCEDURE TRAY) ×2 IMPLANT
UNDERPAD 30X30 INCONTINENT (UNDERPADS AND DIAPERS) ×2 IMPLANT

## 2013-06-10 NOTE — Anesthesia Postprocedure Evaluation (Signed)
  Anesthesia Post-op Note  Patient: Megan Ford  Procedure(s) Performed: Procedure(s): LEFT CARPAL TUNNEL RELEASE (Left)  Patient Location: PACU  Anesthesia Type:General  Level of Consciousness: awake and alert   Airway and Oxygen Therapy: Patient Spontanous Breathing  Post-op Pain: mild  Post-op Assessment: Post-op Vital signs reviewed, Patient's Cardiovascular Status Stable, Respiratory Function Stable, Patent Airway, No signs of Nausea or vomiting and Pain level controlled  Post-op Vital Signs: Reviewed and stable  Last Vitals:  Filed Vitals:   06/10/13 0945  BP:   Pulse: 63  Temp:   Resp: 13    Complications: No apparent anesthesia complications

## 2013-06-10 NOTE — Brief Op Note (Signed)
06/10/2013  9:08 AM  PATIENT:  Megan Ford  48 y.o. female  PRE-OPERATIVE DIAGNOSIS:  LEFT CARPAL TUNNEL SYNDROME   POST-OPERATIVE DIAGNOSIS:  LEFT CARPAL TUNNEL SYNDROME   PROCEDURE:  Procedure(s): LEFT CARPAL TUNNEL RELEASE (Left)  SURGEON:  Surgeon(s) and Role:    * Cammie Sickle., MD - Primary  PHYSICIAN ASSISTANT:   ASSISTANTS: surgical tech   ANESTHESIA:   MAC  EBL:  Total I/O In: 700 [I.V.:700] Out: -   BLOOD ADMINISTERED:none  DRAINS: none   LOCAL MEDICATIONS USED:  LIDOCAINE   SPECIMEN:  No Specimen  DISPOSITION OF SPECIMEN:  N/A  COUNTS:  YES  TOURNIQUET:   Total Tourniquet Time Documented: Upper Arm (Left) - 12 minutes Total: Upper Arm (Left) - 12 minutes   DICTATION: .Other Dictation: Dictation Number [id number]  PLAN OF CARE: Discharge to home after PACU  PATIENT DISPOSITION:  PACU - hemodynamically stable.   Delay start of Pharmacological VTE agent (>24hrs) due to surgical blood loss or risk of bleeding: not applicable

## 2013-06-10 NOTE — Anesthesia Procedure Notes (Signed)
Procedure Name: LMA Insertion Date/Time: 06/10/2013 8:30 AM Performed by: Lyndee Leo Pre-anesthesia Checklist: Patient identified, Emergency Drugs available, Suction available and Patient being monitored Patient Re-evaluated:Patient Re-evaluated prior to inductionOxygen Delivery Method: Circle System Utilized Preoxygenation: Pre-oxygenation with 100% oxygen Intubation Type: IV induction Ventilation: Mask ventilation without difficulty LMA: LMA inserted LMA Size: 3.0 Number of attempts: 1 Airway Equipment and Method: bite block Placement Confirmation: positive ETCO2 Tube secured with: Tape Dental Injury: Teeth and Oropharynx as per pre-operative assessment

## 2013-06-10 NOTE — Discharge Instructions (Signed)

## 2013-06-10 NOTE — Transfer of Care (Signed)
Immediate Anesthesia Transfer of Care Note  Patient: Megan Ford  Procedure(s) Performed: Procedure(s): LEFT CARPAL TUNNEL RELEASE (Left)  Patient Location: PACU  Anesthesia Type:General  Level of Consciousness: awake and sedated  Airway & Oxygen Therapy: Patient Spontanous Breathing and Patient connected to face mask oxygen  Post-op Assessment: Report given to PACU RN and Post -op Vital signs reviewed and stable  Post vital signs: Reviewed and stable  Complications: No apparent anesthesia complications

## 2013-06-10 NOTE — Op Note (Signed)
[id number] 

## 2013-06-10 NOTE — Brief Op Note (Signed)
06/10/2013  9:17 AM  PATIENT:  Megan Ford  48 y.o. female  PRE-OPERATIVE DIAGNOSIS:  LEFT CARPAL TUNNEL SYNDROME   POST-OPERATIVE DIAGNOSIS:  LEFT CARPAL TUNNEL SYNDROME   PROCEDURE:  Procedure(s): LEFT CARPAL TUNNEL RELEASE (Left)  SURGEON:  Surgeon(s) and Role:    * Cammie Sickle., MD - Primary  PHYSICIAN ASSISTANT:   ASSISTANTS: surgical tech  ANESTHESIA:   general  EBL:  Total I/O In: 700 [I.V.:700] Out: -   BLOOD ADMINISTERED:none  DRAINS: none   LOCAL MEDICATIONS USED:  XYLOCAINE   SPECIMEN:  No Specimen  DISPOSITION OF SPECIMEN:  N/A  COUNTS:  YES  TOURNIQUET:   Total Tourniquet Time Documented: Upper Arm (Left) - 12 minutes Total: Upper Arm (Left) - 12 minutes   DICTATION: .Other Dictation: Dictation Number [id number]  PLAN OF CARE: Discharge to home after PACU  PATIENT DISPOSITION:  PACU - hemodynamically stable.   Delay start of Pharmacological VTE agent (>24hrs) due to surgical blood loss or risk of bleeding: not applicable

## 2013-06-10 NOTE — Op Note (Signed)
[id number]  

## 2013-06-10 NOTE — Anesthesia Preprocedure Evaluation (Signed)
Anesthesia Evaluation  Patient identified by MRN, date of birth, ID band Patient awake    Reviewed: Allergy & Precautions, H&P , NPO status , Patient's Chart, lab work & pertinent test results  History of Anesthesia Complications Negative for: history of anesthetic complications  Airway Mallampati: II TM Distance: >3 FB Neck ROM: Full    Dental  (+) Teeth Intact,    Pulmonary neg pulmonary ROS,  breath sounds clear to auscultation        Cardiovascular negative cardio ROS  Rhythm:Regular     Neuro/Psych negative neurological ROS  negative psych ROS   GI/Hepatic Neg liver ROS, GERD-  Controlled,  Endo/Other  negative endocrine ROS  Renal/GU negative Renal ROS     Musculoskeletal   Abdominal   Peds  Hematology negative hematology ROS (+)   Anesthesia Other Findings endometriosis  Reproductive/Obstetrics                           Anesthesia Physical Anesthesia Plan  ASA: II  Anesthesia Plan: General   Post-op Pain Management:    Induction: Intravenous  Airway Management Planned: LMA  Additional Equipment: None  Intra-op Plan:   Post-operative Plan: Extubation in OR  Informed Consent: I have reviewed the patients History and Physical, chart, labs and discussed the procedure including the risks, benefits and alternatives for the proposed anesthesia with the patient or authorized representative who has indicated his/her understanding and acceptance.   Dental advisory given  Plan Discussed with: CRNA and Surgeon  Anesthesia Plan Comments:         Anesthesia Quick Evaluation

## 2013-06-11 ENCOUNTER — Encounter (HOSPITAL_BASED_OUTPATIENT_CLINIC_OR_DEPARTMENT_OTHER): Payer: Self-pay | Admitting: Orthopedic Surgery

## 2013-06-11 NOTE — Op Note (Signed)
[name], [name] [facility]                ACCOUNT NO.:  [id number]  MEDICAL RECORD NO.:  [id number]  LOCATION:                                 FACILITY:  PHYSICIAN:  Megan Mighty. Marka Treloar, M.D. DATE OF BIRTH:  [birth year]  DATE OF PROCEDURE:  06/10/2013 DATE OF DISCHARGE:                              OPERATIVE REPORT   PREOPERATIVE DIAGNOSES:  Chronic entrapment neuropathy, left median nerve at carpal tunnel.  POSTOPERATIVE DIAGNOSES:  Chronic entrapment neuropathy, left median nerve at carpal tunnel.  OPERATIONS:  Release of left transverse carpal ligament.  OPERATING SURGEON:  Megan Mighty. Boluwatife Mutchler, M.D.  ASSISTANT:  Surgical technician.  ANESTHESIA:  General by LMA.  SUPERVISING ANESTHESIOLOGIST:  Dr. Ermalene Postin.  INDICATIONS:  Megan Ford is a 48 year old [place] woman who was brought for evaluation and management of a chronically numb left hand. She has a history of nocturnal and daytime numbness.  She did not have thenar atrophy and her sweat patterns were decreased.  Electrodiagnostic studies confirmed very significant left carpal tunnel syndrome.  Due to failure to respond to nonoperative measures, she is brought to the operating at this time for release of her left transverse carpal ligament.  Preoperatively, with the aid of a [place] Nutritional therapist, we discussed the surgery, aftercare, and potential risks and benefits. Questions were invited and answered in detail.  Proper surgical site was marked per  protocol with a marking pen in the holding area.  She was interviewed by Dr. Ermalene Postin of Anesthesia.  General anesthesia by LMA technique was recommended and accepted.  PROCEDURE IN DETAIL:  Megan Ford was brought to room 2 of the [place] and placed supine position on the operating table.  Following the induction of general anesthesia by LMA technique, the left hand and arm were prepped with Betadine soap and solution, sterilely draped.  A pneumatic  tourniquet was applied to the proximal brachium.  Following exsanguination of left arm with Esmarch bandage, arterial tourniquet was inflated to 220 mmHg.  A routine surgical time-out was accomplished proceeding with a 2-cm incision in the line of the ring finger in the proximal palm. Subcutaneous tissues were carefully divided revealing the palmar fascia.  This was  split longitudinally to reveal the common sensory branches of the median nerve. These were followed back to the transverse carpal ligament, which was gently isolated from median nerve with a Insurance account manager.  Once a pathway was cleared superficial and deep to the transverse carpal ligament, it was released with scissors along its ulnar border. The volar forearm fascia was released for 4 cm above the distal wrist flexion crease.  Bleeding points were electrocauterized with bipolar current followed by repair of the skin with intradermal 3-0 Prolene suture.  A compressive dressing was applied with a volar plaster splint, maintaining the wrist in 15 degrees dorsiflexion.  For aftercare, Megan Ford is provided a prescription for Tylenol with Codeine 1-2 tablets p.o. q.4-6 h. p.r.n. pain, 30 tablets, no refill.     Megan Ford, M.D.     RVS/MEDQ  D:  06/10/2013  T:  06/10/2013  Job:  [id number]

## 2013-12-03 ENCOUNTER — Ambulatory Visit (INDEPENDENT_AMBULATORY_CARE_PROVIDER_SITE_OTHER): Payer: BC Managed Care – PPO | Admitting: Family Medicine

## 2013-12-03 VITALS — BP 122/78 | HR 73 | Temp 98.0°F | Resp 16 | Ht 60.0 in | Wt 136.6 lb

## 2013-12-03 DIAGNOSIS — R059 Cough, unspecified: Secondary | ICD-10-CM

## 2013-12-03 DIAGNOSIS — J029 Acute pharyngitis, unspecified: Secondary | ICD-10-CM

## 2013-12-03 DIAGNOSIS — K219 Gastro-esophageal reflux disease without esophagitis: Secondary | ICD-10-CM

## 2013-12-03 DIAGNOSIS — J0110 Acute frontal sinusitis, unspecified: Secondary | ICD-10-CM

## 2013-12-03 DIAGNOSIS — R05 Cough: Secondary | ICD-10-CM

## 2013-12-03 MED ORDER — BENZONATATE 100 MG PO CAPS: 200.0000 mg | ORAL_CAPSULE | Freq: Two times a day (BID) | ORAL | Status: DC | PRN

## 2013-12-03 MED ORDER — AMOXICILLIN 500 MG PO TABS: 500.0000 mg | ORAL_TABLET | Freq: Two times a day (BID) | ORAL | Status: DC

## 2013-12-03 MED ORDER — FLUTICASONE PROPIONATE 50 MCG/ACT NA SUSP: 2.0000 | Freq: Every day | NASAL | Status: DC

## 2013-12-03 MED ORDER — OMEPRAZOLE 20 MG PO CPDR: 20.0000 mg | DELAYED_RELEASE_CAPSULE | Freq: Every day | ORAL | Status: DC

## 2013-12-03 NOTE — Progress Notes (Signed)
 Chief Complaint:  Chief Complaint  Patient presents with  . Cough    off/on for about 4 months; denies fevers  . Sore Throat  . Emesis    pt states when she eats she vomits right back due to sore throat  . Generalized Body Aches    HPI: Megan Ford is a 48 y.o. female who is here for : 1. 1 week history of sore throat, she has had productive cough was but now dry. Had subjective fevers. She has had HA and also sinus pressure. No recent travels or sick contacts. She has had mucus drainage.. She deneis CP or SOB. She feels tired. No rashes,nausea, vomiting or diarrhea 2. She has a long history of feeling as if there is stomach acid that is in her stomach that goes up her throat and often she will feel like she is eating and she has a feelign of food stuck in her throat. She has minimla nausea when this happens, she does not throw up. She has a history of GERD She has never had an endoscopy  Past Medical History  Diagnosis Date  . GERD (gastroesophageal reflux disease)   . Ovarian cyst    Past Surgical History  Procedure Laterality Date  . Laparoscopy N/A 01/09/2013    Procedure: LAPAROSCOPY DIAGNOSTIC;  Surgeon: Woodroe Mode, MD;  Location: [facility];  Service: Gynecology;  Laterality: N/A;  . Carpal tunnel release Left 06/10/2013    Procedure: LEFT CARPAL TUNNEL RELEASE;  Surgeon: Cammie Sickle., MD;  Location: [place];  Service: Orthopedics;  Laterality: Left;   History   Social History  . Marital Status: Married    Spouse Name: N/A    Number of Children: N/A  . Years of Education: N/A   Social History Main Topics  . Smoking status: Never Smoker   . Smokeless tobacco: Never Used  . Alcohol Use: No  . Drug Use: No  . Sexual Activity: Yes   Other Topics Concern  . None   Social History Narrative  . None   Family History  Problem Relation Age of Onset  . Asthma Father    No Known Allergies Prior to Admission medications   Medication  Sig Start Date End Date Taking? Authorizing Provider  ibuprofen (ADVIL,MOTRIN) 600 MG tablet Take 1 tablet (600 mg total) by mouth every 6 (six) hours as needed for pain. 08/22/12  Yes Woodroe Mode, MD  LETROZOLE PO Take 5 mg by mouth every morning.   Yes Historical Provider, MD     ROS: The patient denies fevers, chills, night sweats, unintentional weight loss, chest pain, palpitations, wheezing, dyspnea on exertion, nausea, vomiting, abdominal pain, dysuria, hematuria, melena, numbness, weakness, or tingling.   All other systems have been reviewed and were otherwise negative with the exception of those mentioned in the HPI and as above.    PHYSICAL EXAM: Filed Vitals:   12/03/13 1124  BP: 122/78  Pulse: 73  Temp: 98 F (36.7 C)  Resp: 16   Filed Vitals:   12/03/13 1124  Height: 5' (1.524 m)  Weight: 136 lb 9.6 oz (61.961 kg)   Body mass index is 26.68 kg/(m^2).  General: Alert, no acute distress HEENT:  Normocephalic, atraumatic, oropharynx patent. EOMI, PERRLA TM nl, erythematous throat, No exudates. + boggy nares, + sinus tenderness. Cardiovascular:  Regular rate and rhythm, no rubs murmurs or gallops.  No Carotid bruits, radial pulse intact. No pedal edema.  Respiratory: Clear to auscultation bilaterally.  No wheezes, rales, or rhonchi.  No cyanosis, no use of accessory musculature GI: No organomegaly, abdomen is soft and non-tender, positive bowel sounds.  No masses. Skin: No rashes. Neurologic: Facial musculature symmetric. Psychiatric: Patient is appropriate throughout our interaction. Lymphatic: No cervical lymphadenopathy Musculoskeletal: Gait intact.   LABS: Results for orders placed during the hospital encounter of 06/10/13  POCT HEMOGLOBIN-HEMACUE      Result Value Ref Range   Hemoglobin 12.9  12.0 - 15.0 g/dL     EKG/XRAY:   Primary read interpreted by Dr. Marin Comment at [facility].   ASSESSMENT/PLAN: Encounter Diagnoses  Name Primary?  . Acute pharyngitis,  unspecified pharyngitis type Yes  . Acute frontal sinusitis, recurrence not specified   . Cough   . Gastroesophageal reflux disease without esophagitis    Rx Amoxacillin Rx Tessalon Perles Rx Flonase Rx Omeprazole If sxs worsen f/u, will refer to GI for worsenign reflux xsx, info in [place] given    Gross sideeffects, risk and benefits, and alternatives of medications d/w patient. Patient is aware that all medications have potential sideeffects and we are unable to predict every sideeffect or drug-drug interaction that may occur.  , [place], DO 12/03/2013 12:03 PM

## 2013-12-03 NOTE — Patient Instructions (Signed)
B?nh Tro Ng??c D? Dy Th?c Qu?n, Ng??i L?n (Gastroesophageal Reflux Diseaes, Adult) B?nh tro ng??c d? dy th?c qu?n (GERD) x?y ra khi axit t? d? dy tro ln th?c qu?n. Khi axit ti?p xc v?i th?c qu?n, axit gy ra ?au (vim) trong th?c qu?n. Theo th?i gian, GERD c th? t?o ra cc l? nh? (cc v?t lot) ? nim m?c th?c qu?n.  NGUYN NHN  Tr?ng l??ng c? th? t?ng. ?i?u ny t?o p l?c ln d? dy, lm t?ng axit t? d? dy vo th?c qu?n.  Ht thu?c l. Ht thu?c l lm t?ng s?n sinh axit trong d? dy.  U?ng r??u. ?y l nguyn nhn lm gi?m p l?c trong c? th?t th?c qu?n d??i (van ho?c vng c? gi?a th?c qu?n v d? dy), cho php axit t? d? dy vo th?c qu?n.  ?n t?i mu?n v b?ng no. Tnh tr?ng ny lm t?ng p l?c c?ng nh? t?ng s?n sinh axit trong d? dy.  D? t?t c? th?t th?c qu?n d??i. ?i khi khng tm th?y nguyn nhn. TRI?U CH?NG  ?au rt ? ph?n d??i gi?a ng?c pha sau x??ng ?c v ? khu v?c gi?a d? dy. Hi?n t??ng ny c th? x?y ra hai l?n m?t tu?n ho?c th??ng xuyn h?n.  Kh nu?t.  ?au h?ng.  Ho khan.  Cc tri?u ch?ng gi?ng hen suy?n, bao g?m t?c ng?c,kh th? ho?c th? kh kh. CH?N ?ON Chuyn gia ch?m [place] s?c kh?e c th? ch?n ?on GERD d?a trn cc tri?u ch?ng c?a b?n. Trong m?t s? tr??ng h?p, ch?p X quang v cc xt nghi?m khc c th? ???c ti?n hnh ?? ki?m tra cc bi?n ch?ng ho?c tnh tr?ng c?a d? dy v th?c qu?n. ?I?U TR? Chuyn gia ch?m [place] s?c kh?e c th? khuy?n ngh? dng thu?c khng c?n k ??n ho?c thu?c c?n k ??n ?? gip gi?m s?n sinh axit. Hy h?i chuyn gia ch?m [place] s?c kh?e c?a b?n tr??c khi b?t ??u ho?c dng thm b?t k? lo?i thu?c m?i no. H??NG D?N CH?M [place] T?I NH  Thay ??i cc y?u t? m b?n c th? ki?m sot ???c. H?i chuyn gia ch?m [place] s?c kh?e ?? ???c h??ng d?n v? vi?c gi?m cn, b? thu?c l v s? d?ng r??u.  Hessie Diener cc lo?i th?c ph?m v ?? u?ng lm cho cc tri?u ch?ng t?i t? h?n, ch?ng h?n nh?:  ?? u?ng c caffeine ho?c r??u.  S c la.  B?c h ho?c v? b?c  h.  T?i v hnh ty.  Th?c ?n [place].  Tri cy h? cam, ch?ng h?n nh? cam, chanh hay chanh ty.  Cc th?c ?n c c chua, ch?ng h?n nh? n??c x?t, ?t, salsa (n??c x?t [place]) v bnh pizza.  Cc lo?i th?c ?n chin xo v nhi?u ch?t bo.  Trnh n?m xu?ng ng? 3 ti?ng tr??c gi? ?i ng? ho?c tr??c khi c m?t gi?c ng? ng?n.  ?n nh?ng b?a ?n nh?, th??ng xuyn h?n thay v cc b?a ?n l?n.  M?c qu?n o r?ng. Khng ?eo b?t c? th? g ch?t quanh th?t l?ng gy p l?c ln d? dy.  Nng ??u gi??ng cao ln t? 6 ??n 8 inch b?ng cc kh?i g? ?? gip b?n ng?. S? d?ng thm g?i s? khng c tc d?ng.  Ch? s? d?ng thu?c khng c?n k ??n ho?c thu?c c?n k ??n ?? gi?m ?au, gi?m c?m gic kh ch?u ho?c h? s?t theo ch? d?n c?a chuyn gia ch?m [place] s?c kh?e c?a b?n.  Khng dng thu?c atpirin, ibuprofen ho?c cc thu?c ch?ng vim khng c steroid (NSAID) khc. HY NGAY L?P T?C ?I KHM N?U:  B?n b? ?au ? cnh tay, c?, hm, r?ng ho?c l?ng.  Hi?n t??ng ?au t?ng ln ho?c thay ??i theo c??ng ?? ho?c th?i gian.  B?n b? bu?n nn, nn ho?c ?? m? hi (tot m? hi).  B?n b? kh th? ho?c ng?t x?u.  Ch?t nn c mu xanh l cy, vng, ?en ho?c trng gi?ng nh? b c ph ho?c mu.  Phn c mu ??, ?? nh? mu ho?c ?en. Nh?ng tri?u ch?ng ny c th? l d?u hi?u c?a cc v?n ?? khc, ch?ng h?n nh? b?nh tim, ch?y mu d? dy ho?c ch?y mu th?c qu?n. ??M B?O B?N:  Hi?u cc h??ng d?n ny.  S? theo di tnh tr?ng c?a mnh.  S? yu c?u tr? gip ngay l?p t?c n?u b?n c?m th?y khng ?? ho?c tnh tr?ng tr?m tr?ng h?n. Document Released: 11/16/2004 Document Revised: 10/09/2012 [facility] Patient Information 2015 [place]. This information is not intended to replace advice given to you by your health care provider. Make sure you discuss any questions you have with your health care provider.

## 2014-01-23 IMAGING — US US TRANSVAGINAL NON-OB
1 series · 13 of 25 positions shown · non-contrast
Comparison: CT 08/20/2012

CLINICAL DATA: Right sided pelvic pain.  History of ovarian cysts.
LMP 09/26/2012



[Series 1: us pelvis complete · 60 acquisitions, 13 frames shown]
[im 1/60]
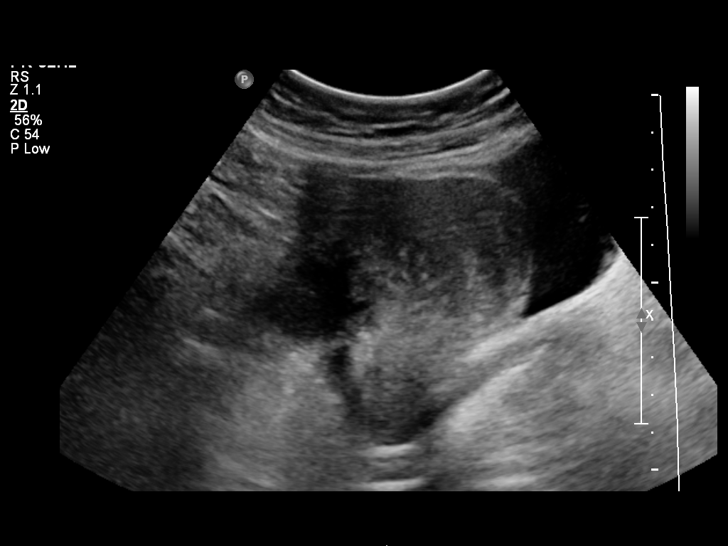
[im 5/60]
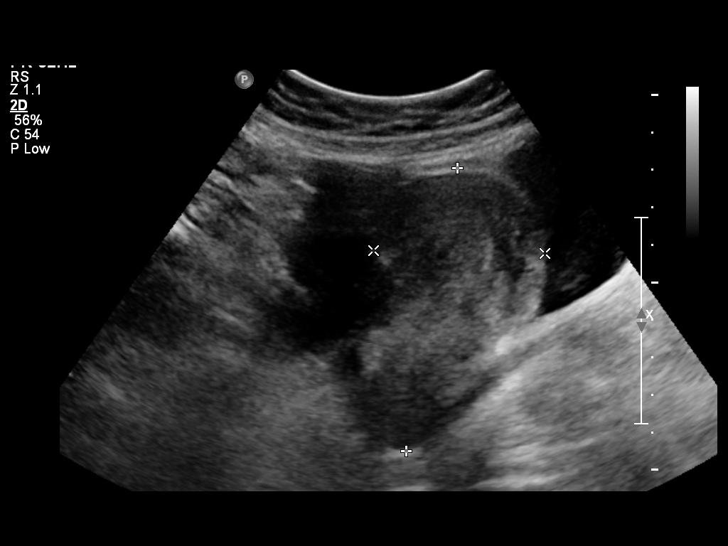
[im 10/60]
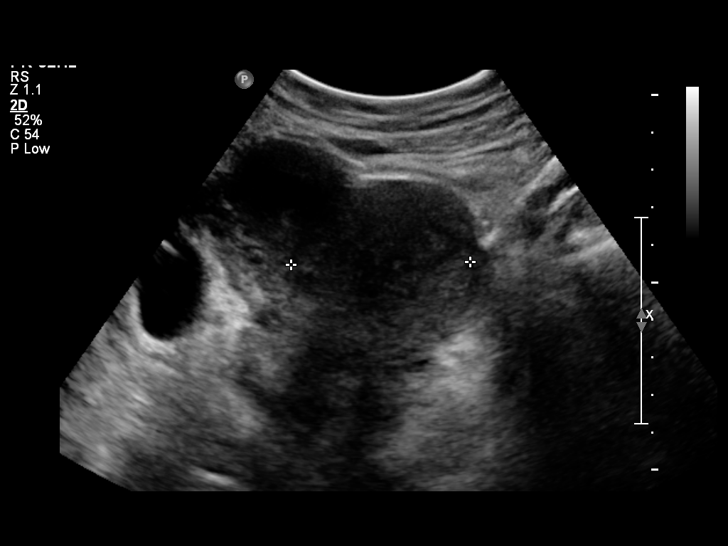
[im 15/60]
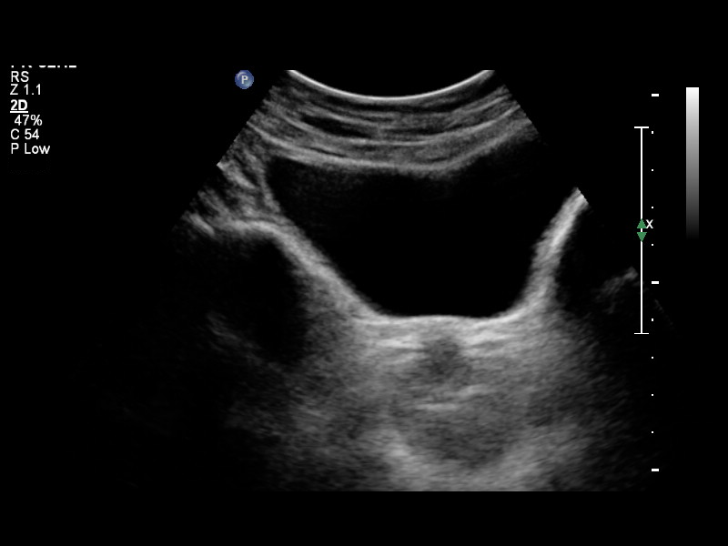
[im 20/60]
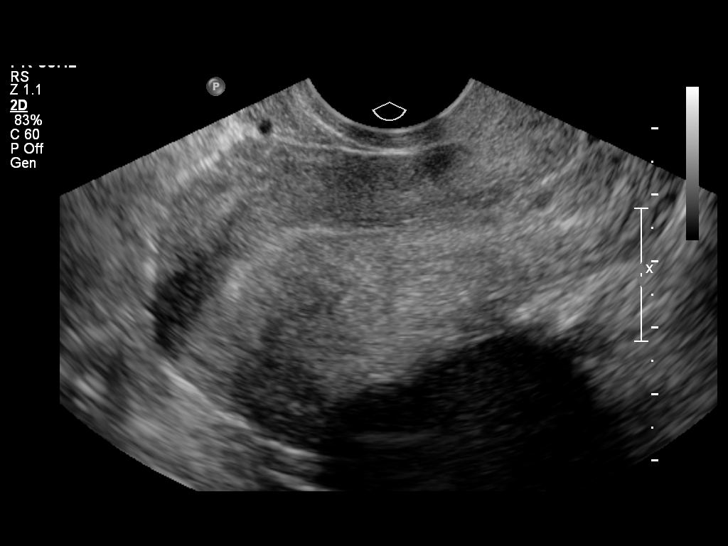
[im 25/60]
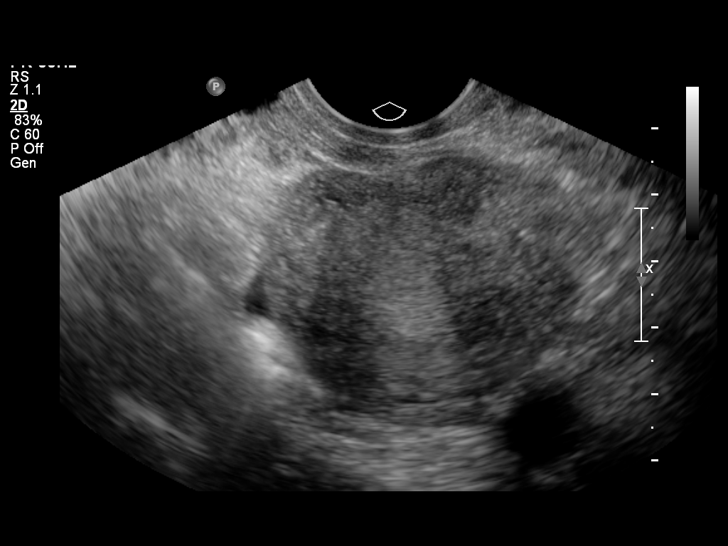
[im 30/60]
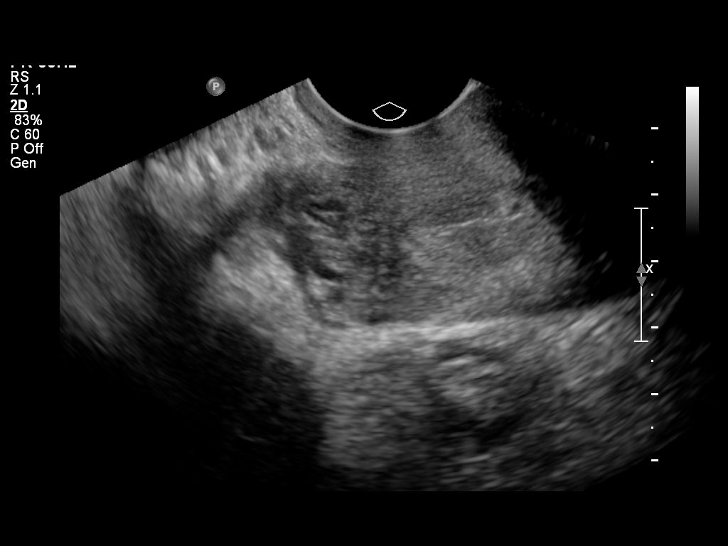
[im 35/60]
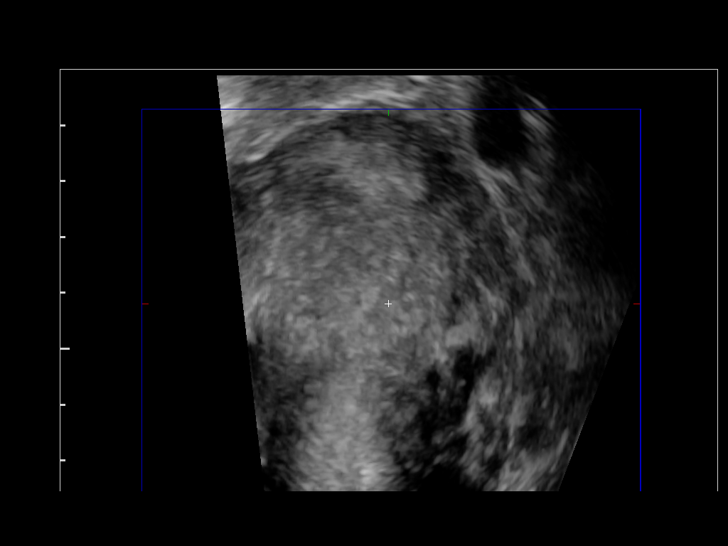
[im 40/60]
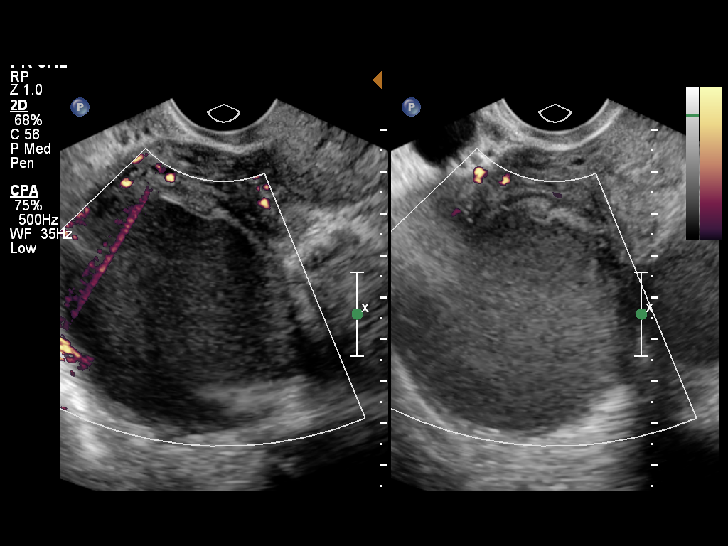
[im 45/60]
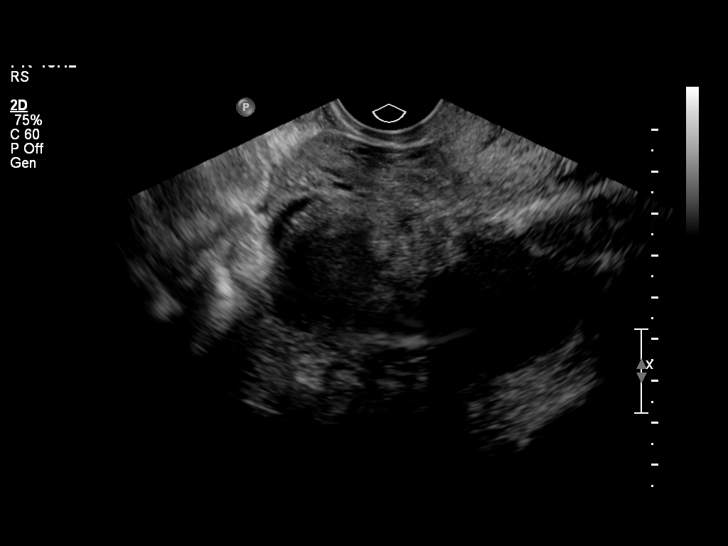
[im 50/60]
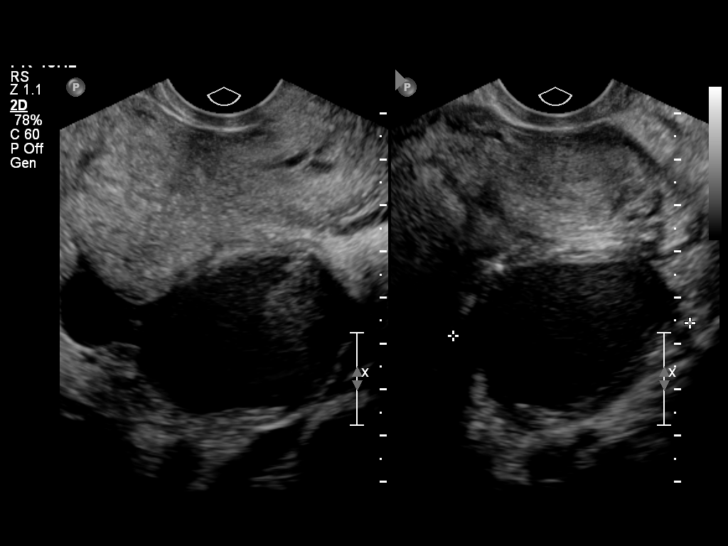
[im 55/60]
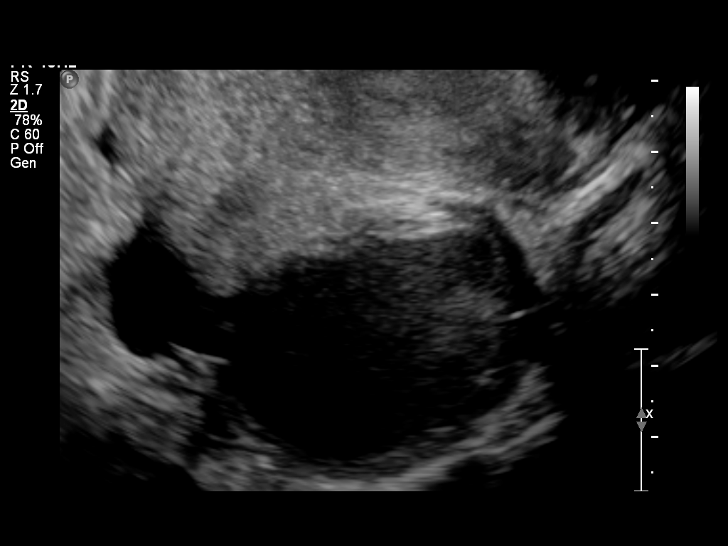
[im 60/60]
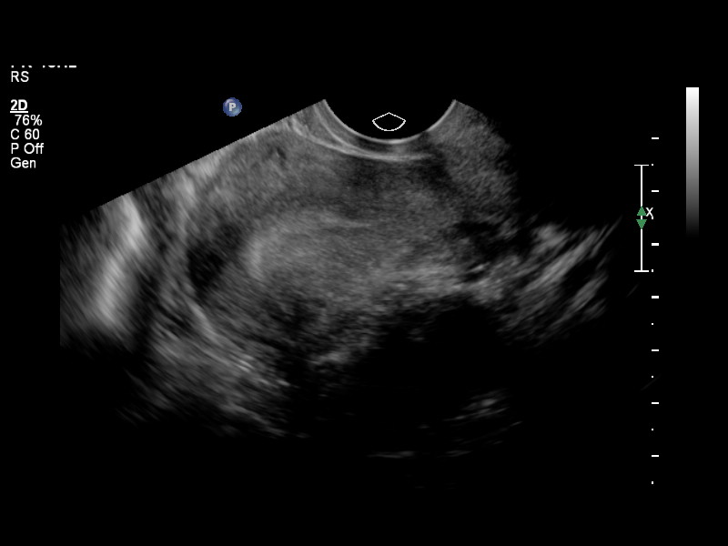

[13 of 25 positions shown; findings below may reference images not displayed]

FINDINGS: Uterus: Is anteverted and anteflexed and demonstrates a sagittal
length of 8.3 cm, depth of 4.3 cm and width of 5.2 cm.  No focal
mural abnormality is seen

Endometrium: Is thin and echogenic with a width of 7 mm.  No areas
of focal thickening or heterogeneity are seen

Right ovary:  Measures 6.1 x 7.0 x 6.4 cm and contains a complex
cystic lesion measuring 5.0 x 5.6 x 4.6 cm which contains diffuse
low level echoes with slightly higher echogenicity dependent low
level echoes. Internal cystic contents are avascular with color
Doppler exam and the appearance is most compatible with an
endometrioma. An acute hemorrhagic cyst could have a similar
appearance.

Left ovary: Measures 6.7 x 4.2 x 4.9 cm and contains a complex
cystic area measuring 4.6 x 4.8 x 3.3 cm.  This contains low-level
echoes with the inferior leads positioned echoes being slightly
more echogenic in nature.  Again the appearance is most suspicious
for a endometrioma although a hemorrhagic cyst could have a similar
appearance.

Other findings: No pelvic fluid is seen
IMPRESSION: Bilateral complex ovarian cyst with an appearance most suspicious
for bilateral endometrioma.  Lack of significant change in size
since prior CT 08/20/2012 would make this a more probable diagnosis
than bilateral hemorrhagic cysts. Given the history of pelvic pain,
short-term reevaluation can be performed if desired in the
postsecretory phase of the cycle following the next complete cycle
to confirm this impression.  Endometrioma would be expected to
remain unchanged in appearance while hemorrhagic cysts would be
expected to demonstrate interval evolution/resolution..

## 2014-03-20 ENCOUNTER — Other Ambulatory Visit: Payer: No Typology Code available for payment source

## 2014-03-20 ENCOUNTER — Encounter: Payer: Self-pay | Admitting: Family

## 2014-03-20 ENCOUNTER — Ambulatory Visit (INDEPENDENT_AMBULATORY_CARE_PROVIDER_SITE_OTHER): Payer: No Typology Code available for payment source | Admitting: Family

## 2014-03-20 DIAGNOSIS — K219 Gastro-esophageal reflux disease without esophagitis: Secondary | ICD-10-CM

## 2014-03-20 DIAGNOSIS — IMO0002 Reserved for concepts with insufficient information to code with codable children: Secondary | ICD-10-CM

## 2014-03-20 DIAGNOSIS — N803 Endometriosis of pelvic peritoneum: Secondary | ICD-10-CM

## 2014-03-20 MED ORDER — IBUPROFEN 600 MG PO TABS: 600.0000 mg | ORAL_TABLET | Freq: Three times a day (TID) | ORAL | Status: DC | PRN

## 2014-03-20 MED ORDER — PANTOPRAZOLE SODIUM 40 MG PO TBEC: 40.0000 mg | DELAYED_RELEASE_TABLET | Freq: Every day | ORAL | Status: DC

## 2014-03-20 NOTE — Progress Notes (Signed)
Pre visit review using our clinic review tool, if applicable. No additional management support is needed unless otherwise documented below in the visit note. 

## 2014-03-20 NOTE — Progress Notes (Signed)
Subjective:    Patient ID: Megan Ford, female    DOB: 1965-08-14, 49 y.o.   MRN: [id number]  Chief Complaint  Patient presents with  . Establish Care    says she gets really bad indegestion often and feels nausea, and sometime it does make her vomit, really bad heartburn    HPI:  Megan Ford is a 49 y.o. female who presents today to establish care and discuss her heartburn and endometrioisis.     1) Endometrosis - Associated symptoms of sharp lower left quadrant abdominal pain has been going on since of November 2014. She was previously seen at [facility] and diagnosed with endrometriosis and sent to a specialist for a cyst. Pain intensity is rated at 7/10. Currently being treated with 600 mg of ibuprofen and an unknown medication.   2) GERD -  Associated symptoms of acid reflux has been going on for years. Not related to any particular food. Was started on omeprazole, however she never took it.    No Known Allergies   No current outpatient prescriptions on file prior to visit.   No current facility-administered medications on file prior to visit.    Past Medical History  Diagnosis Date  . GERD (gastroesophageal reflux disease)   . Ovarian cyst Endometriois    Past Surgical History  Procedure Laterality Date  . Laparoscopy N/A 01/09/2013    Procedure: LAPAROSCOPY DIAGNOSTIC;  Surgeon: Woodroe Mode, MD;  Location: [place] [facility];  Service: Gynecology;  Laterality: N/A;  . Carpal tunnel release Left 06/10/2013    Procedure: LEFT CARPAL TUNNEL RELEASE;  Surgeon: Cammie Sickle., MD;  Location: [place];  Service: Orthopedics;  Laterality: Left;    Family History  Problem Relation Age of Onset  . Asthma Father     History   Social History  . Marital Status: Married    Spouse Name: N/A    Number of Children: N/A  . Years of Education: N/A   Occupational History  . Not on file.   Social History Main Topics  . Smoking status: Never Smoker     . Smokeless tobacco: Never Used  . Alcohol Use: No  . Drug Use: No  . Sexual Activity: Yes   Other Topics Concern  . Not on file   Social History Narrative     Review of Systems  Constitutional: Negative for fever and chills.  Gastrointestinal: Positive for abdominal pain and constipation. Negative for nausea and vomiting.  Genitourinary: Negative for dysuria, urgency, frequency, flank pain, vaginal bleeding, vaginal discharge and vaginal pain.      Objective:    BP 122/80 mmHg  Pulse 70  Temp(Src) 98.1 F (36.7 C) (Oral)  Resp 18  Ht 5\' 1"  (1.549 m)  Wt 143 lb 6.4 oz (65.046 kg)  BMI 27.11 kg/m2  SpO2 98% Nursing note and vital signs reviewed.  Physical Exam  Constitutional: She is oriented to person, place, and time. She appears well-developed and well-nourished. No distress.  Cardiovascular: Normal rate, regular rhythm, normal heart sounds and intact distal pulses.   Pulmonary/Chest: Effort normal and breath sounds normal.  Abdominal: Soft. Normal appearance and bowel sounds are normal. She exhibits no distension, no ascites and no mass. There is no hepatosplenomegaly. There is tenderness in the left lower quadrant. There is no rigidity, no rebound, no guarding, no CVA tenderness, no tenderness at McBurney's point and negative Murphy's sign.  Neurological: She is alert and oriented to person, place, and time.  Skin: Skin is warm and dry.  Psychiatric: She has a normal mood and affect. Her behavior is normal. Judgment and thought content normal.       Assessment & Plan:

## 2014-03-20 NOTE — Patient Instructions (Signed)
Thank you for choosing [facility].  Summary/Instructions:  Your prescription(s) have been submitted to your pharmacy or been printed and provided for you. Please take as directed and contact our office if you believe you are having problem(s) with the medication(s) or have any questions.  Please stop by the lab on the basement level of the building for your blood work. Your results will be released to [place] (or called to you) after review, usually within 72 hours after test completion. If any changes need to be made, you will be notified at that same time.  If your symptoms worsen or fail to improve, please contact our office for further instruction, or in case of emergency go directly to the emergency room at the closest medical facility.     B?nh Tro Ng??c D? Dy Th?c Qu?n, Ng??i L?n (Gastroesophageal Reflux Diseaes, Adult) B?nh tro ng??c d? dy th?c qu?n (GERD) x?y ra khi axit t? d? dy tro ln th?c qu?n. Khi axit ti?p xc v?i th?c qu?n, axit gy ra ?au (vim) trong th?c qu?n. Theo th?i gian, GERD c th? t?o ra cc l? nh? (cc v?t lot) ? nim m?c th?c qu?n.  NGUYN NHN  Tr?ng l??ng c? th? t?ng. ?i?u ny t?o p l?c ln d? dy, lm t?ng axit t? d? dy vo th?c qu?n.  Ht thu?c l. Ht thu?c l lm t?ng s?n sinh axit trong d? dy.  U?ng r??u. ?y l nguyn nhn lm gi?m p l?c trong c? th?t th?c qu?n d??i (van ho?c vng c? gi?a th?c qu?n v d? dy), cho php axit t? d? dy vo th?c qu?n.  ?n t?i mu?n v b?ng no. Tnh tr?ng ny lm t?ng p l?c c?ng nh? t?ng s?n sinh axit trong d? dy.  D? t?t c? th?t th?c qu?n d??i. ?i khi khng tm th?y nguyn nhn. TRI?U CH?NG  ?au rt ? ph?n d??i gi?a ng?c pha sau x??ng ?c v ? khu v?c gi?a d? dy. Hi?n t??ng ny c th? x?y ra hai l?n m?t tu?n ho?c th??ng xuyn h?n.  Kh nu?t.  ?au h?ng.  Ho khan.  Cc tri?u ch?ng gi?ng hen suy?n, bao g?m t?c ng?c,kh th? ho?c th? kh kh. CH?N ?ON Chuyn gia ch?m [place] s?c kh?e c th? ch?n  ?on GERD d?a trn cc tri?u ch?ng c?a b?n. Trong m?t s? tr??ng h?p, ch?p X quang v cc xt nghi?m khc c th? ???c ti?n hnh ?? ki?m tra cc bi?n ch?ng ho?c tnh tr?ng c?a d? dy v th?c qu?n. ?I?U TR? Chuyn gia ch?m [place] s?c kh?e c th? khuy?n ngh? dng thu?c khng c?n k ??n ho?c thu?c c?n k ??n ?? gip gi?m s?n sinh axit. Hy h?i chuyn gia ch?m [place] s?c kh?e c?a b?n tr??c khi b?t ??u ho?c dng thm b?t k? lo?i thu?c m?i no. H??NG D?N CH?M [place] T?I NH  Thay ??i cc y?u t? m b?n c th? ki?m sot ???c. H?i chuyn gia ch?m [place] s?c kh?e ?? ???c h??ng d?n v? vi?c gi?m cn, b? thu?c l v s? d?ng r??u.  Hessie Diener cc lo?i th?c ph?m v ?? u?ng lm cho cc tri?u ch?ng t?i t? h?n, ch?ng h?n nh?:  ?? u?ng c caffeine ho?c r??u.  S c la.  B?c h ho?c v? b?c h.  T?i v hnh ty.  Th?c ?n [place].  Tri cy h? cam, ch?ng h?n nh? cam, chanh hay chanh ty.  Cc th?c ?n c c chua, ch?ng h?n nh? n??c x?t, ?t, salsa (n??c x?t [place])  v bnh pizza.  Cc lo?i th?c ?n chin xo v nhi?u ch?t bo.  Trnh n?m xu?ng ng? 3 ti?ng tr??c gi? ?i ng? ho?c tr??c khi c m?t gi?c ng? ng?n.  ?n nh?ng b?a ?n nh?, th??ng xuyn h?n thay v cc b?a ?n l?n.  M?c qu?n o r?ng. Khng ?eo b?t c? th? g ch?t quanh th?t l?ng gy p l?c ln d? dy.  Nng ??u gi??ng cao ln t? 6 ??n 8 inch b?ng cc kh?i g? ?? gip b?n ng?. S? d?ng thm g?i s? khng c tc d?ng.  Ch? s? d?ng thu?c khng c?n k ??n ho?c thu?c c?n k ??n ?? gi?m ?au, gi?m c?m gic kh ch?u ho?c h? s?t theo ch? d?n c?a chuyn gia ch?m [place] s?c kh?e c?a b?n.  Khng dng thu?c atpirin, ibuprofen ho?c cc thu?c ch?ng vim khng c steroid (NSAID) khc. HY NGAY L?P T?C ?I KHM N?U:  B?n b? ?au ? cnh tay, c?, hm, r?ng ho?c l?ng.  Hi?n t??ng ?au t?ng ln ho?c thay ??i theo c??ng ?? ho?c th?i gian.  B?n b? bu?n nn, nn ho?c ?? m? hi (tot m? hi).  B?n b? kh th? ho?c ng?t x?u.  Ch?t nn c mu xanh l cy, vng, ?en ho?c trng gi?ng nh? b c ph ho?c  mu.  Phn c mu ??, ?? nh? mu ho?c ?en. Nh?ng tri?u ch?ng ny c th? l d?u hi?u c?a cc v?n ?? khc, ch?ng h?n nh? b?nh tim, ch?y mu d? dy ho?c ch?y mu th?c qu?n. ??M B?O B?N:  Hi?u cc h??ng d?n ny.  S? theo di tnh tr?ng c?a mnh.  S? yu c?u tr? gip ngay l?p t?c n?u b?n c?m th?y khng ?? ho?c tnh tr?ng tr?m tr?ng h?n. Document Released: 11/16/2004 Document Revised: 10/09/2012 [facility] Patient Information 2015 [place]. This information is not intended to replace advice given to you by your health care provider. Make sure you discuss any questions you have with your health care provider.

## 2014-03-20 NOTE — Assessment & Plan Note (Signed)
Symptoms of heartburn consistent with GERD. Start pantoprazole daily. Information guarding GERD provided to patient. Follow-up if symptoms worsen or fail to improve.

## 2014-03-20 NOTE — Assessment & Plan Note (Signed)
Patient continues to experience mild symptoms of endometriosis. Refer to GYN for further management and annual exam. Continue ibuprofen as needed for symptom relief. Follow up if symptoms worsen or fail to improve.

## 2014-04-17 ENCOUNTER — Encounter: Payer: Self-pay | Admitting: Family

## 2014-04-24 ENCOUNTER — Encounter: Payer: Self-pay | Admitting: Family

## 2014-04-24 ENCOUNTER — Ambulatory Visit (INDEPENDENT_AMBULATORY_CARE_PROVIDER_SITE_OTHER): Payer: 59 | Admitting: Family

## 2014-04-24 VITALS — BP 136/82 | HR 69 | Temp 97.9°F | Resp 18 | Ht 61.0 in | Wt 145.0 lb

## 2014-04-24 DIAGNOSIS — Z23 Encounter for immunization: Secondary | ICD-10-CM

## 2014-04-24 DIAGNOSIS — Z0000 Encounter for general adult medical examination without abnormal findings: Secondary | ICD-10-CM | POA: Insufficient documentation

## 2014-04-24 NOTE — Patient Instructions (Addendum)
Thank you for choosing [employer].  Summary/Instructions:   [facility] Obstetrician-Gynecologist Address: [address] [place], [place] [address] [phone]   Please stop by the lab on the basement level of the building for your blood work. Your results will be released to [place] (or called to you) after review, usually within 72 hours after test completion. If any changes need to be made, you will be notified at that same time.  Health Maintenance Adopting a healthy lifestyle and getting preventive care can go a long way to promote health and wellness. Talk with your health care provider about what schedule of regular examinations is right for you. This is a good chance for you to check in with your provider about disease prevention and staying healthy. In between checkups, there are plenty of things you can do on your own. Experts have done a lot of research about which lifestyle changes and preventive measures are most likely to keep you healthy. Ask your health care provider for more information. WEIGHT AND DIET  Eat a healthy diet  Be sure to include plenty of vegetables, fruits, low-fat dairy products, and lean protein.  Do not eat a lot of foods high in solid fats, added sugars, or salt.  Get regular exercise. This is one of the most important things you can do for your health.  Most adults should exercise for at least 150 minutes each week. The exercise should increase your heart rate and make you sweat (moderate-intensity exercise).  Most adults should also do strengthening exercises at least twice a week. This is in addition to the moderate-intensity exercise.  Maintain a healthy weight  Body mass index (BMI) is a measurement that can be used to identify possible weight problems. It estimates body fat based on height and weight. Your health care provider can help determine your BMI and help you achieve or maintain a healthy  weight.  For females 17 years of age and older:   A BMI below 18.5 is considered underweight.  A BMI of 18.5 to 24.9 is normal.  A BMI of 25 to 29.9 is considered overweight.  A BMI of 30 and above is considered obese.  Watch levels of cholesterol and blood lipids  You should start having your blood tested for lipids and cholesterol at 47 years of age, then have this test every 5 years.  You may need to have your cholesterol levels checked more often if:  Your lipid or cholesterol levels are high.  You are older than 47 years of age.  You are at high risk for heart disease.  CANCER SCREENING   Lung Cancer  Lung cancer screening is recommended for adults 6-97 years old who are at high risk for lung cancer because of a history of smoking.  A yearly low-dose CT scan of the lungs is recommended for people who:  Currently smoke.  Have quit within the past 15 years.  Have at least a 30-pack-year history of smoking. A pack year is smoking an average of one pack of cigarettes a day for 1 year.  Yearly screening should continue until it has been 15 years since you quit.  Yearly screening should stop if you develop a health problem that would prevent you from having lung cancer treatment.  Breast Cancer  Practice breast self-awareness. This means understanding how your breasts normally appear and feel.  It also means doing regular breast self-exams. Let your health care provider know about any changes, no  matter how small.  If you are in your 20s or 30s, you should have a clinical breast exam (CBE) by a health care provider every 1-3 years as part of a regular health exam.  If you are 72 or older, have a CBE every year. Also consider having a breast X-ray (mammogram) every year.  If you have a family history of breast cancer, talk to your health care provider about genetic screening.  If you are at high risk for breast cancer, talk to your health care provider about  having an MRI and a mammogram every year.  Breast cancer gene (BRCA) assessment is recommended for women who have family members with BRCA-related cancers. BRCA-related cancers include:  Breast.  Ovarian.  Tubal.  Peritoneal cancers.  Results of the assessment will determine the need for genetic counseling and BRCA1 and BRCA2 testing. Cervical Cancer Routine pelvic examinations to screen for cervical cancer are no longer recommended for nonpregnant women who are considered low risk for cancer of the pelvic organs (ovaries, uterus, and vagina) and who do not have symptoms. A pelvic examination may be necessary if you have symptoms including those associated with pelvic infections. Ask your health care provider if a screening pelvic exam is right for you.   The Pap test is the screening test for cervical cancer for women who are considered at risk.  If you had a hysterectomy for a problem that was not cancer or a condition that could lead to cancer, then you no longer need Pap tests.  If you are older than 65 years, and you have had normal Pap tests for the past 10 years, you no longer need to have Pap tests.  If you have had past treatment for cervical cancer or a condition that could lead to cancer, you need Pap tests and screening for cancer for at least 20 years after your treatment.  If you no longer get a Pap test, assess your risk factors if they change (such as having a new sexual partner). This can affect whether you should start being screened again.  Some women have medical problems that increase their chance of getting cervical cancer. If this is the case for you, your health care provider may recommend more frequent screening and Pap tests.  The human papillomavirus (HPV) test is another test that may be used for cervical cancer screening. The HPV test looks for the virus that can cause cell changes in the cervix. The cells collected during the Pap test can be tested for  HPV.  The HPV test can be used to screen women 101 years of age and older. Getting tested for HPV can extend the interval between normal Pap tests from three to five years.  An HPV test also should be used to screen women of any age who have unclear Pap test results.  After 47 years of age, women should have HPV testing as often as Pap tests.  Colorectal Cancer  This type of cancer can be detected and often prevented.  Routine colorectal cancer screening usually begins at 47 years of age and continues through 47 years of age.  Your health care provider may recommend screening at an earlier age if you have risk factors for colon cancer.  Your health care provider may also recommend using home test kits to check for hidden blood in the stool.  A small camera at the end of a tube can be used to examine your colon directly (sigmoidoscopy or colonoscopy). This is  done to check for the earliest forms of colorectal cancer.  Routine screening usually begins at age 80.  Direct examination of the colon should be repeated every 5-10 years through 47 years of age. However, you may need to be screened more often if early forms of precancerous polyps or small growths are found. Skin Cancer  Check your skin from head to toe regularly.  Tell your health care provider about any new moles or changes in moles, especially if there is a change in a mole's shape or color.  Also tell your health care provider if you have a mole that is larger than the size of a pencil eraser.  Always use sunscreen. Apply sunscreen liberally and repeatedly throughout the day.  Protect yourself by wearing long sleeves, pants, a wide-brimmed hat, and sunglasses whenever you are outside. HEART DISEASE, DIABETES, AND HIGH BLOOD PRESSURE   Have your blood pressure checked at least every 1-2 years. High blood pressure causes heart disease and increases the risk of stroke.  If you are between 66 years and 31 years old, ask  your health care provider if you should take aspirin to prevent strokes.  Have regular diabetes screenings. This involves taking a blood sample to check your fasting blood sugar level.  If you are at a normal weight and have a low risk for diabetes, have this test once every three years after 47 years of age.  If you are overweight and have a high risk for diabetes, consider being tested at a younger age or more often. PREVENTING INFECTION  Hepatitis B  If you have a higher risk for hepatitis B, you should be screened for this virus. You are considered at high risk for hepatitis B if:  You were born in a country where hepatitis B is common. Ask your health care provider which countries are considered high risk.  Your parents were born in a high-risk country, and you have not been immunized against hepatitis B (hepatitis B vaccine).  You have HIV or AIDS.  You use needles to inject street drugs.  You live with someone who has hepatitis B.  You have had sex with someone who has hepatitis B.  You get hemodialysis treatment.  You take certain medicines for conditions, including cancer, organ transplantation, and autoimmune conditions. Hepatitis C  Blood testing is recommended for:  Everyone born from [birth year] through 1963.  Anyone with known risk factors for hepatitis C. Sexually transmitted infections (STIs)  You should be screened for sexually transmitted infections (STIs) including gonorrhea and chlamydia if:  You are sexually active and are younger than 47 years of age.  You are older than 47 years of age and your health care provider tells you that you are at risk for this type of infection.  Your sexual activity has changed since you were last screened and you are at an increased risk for chlamydia or gonorrhea. Ask your health care provider if you are at risk.  If you do not have HIV, but are at risk, it may be recommended that you take a prescription medicine daily to  prevent HIV infection. This is called pre-exposure prophylaxis (PrEP). You are considered at risk if:  You are sexually active and do not regularly use condoms or know the HIV status of your partner(s).  You take drugs by injection.  You are sexually active with a partner who has HIV. Talk with your health care provider about whether you are at high risk of being infected  with HIV. If you choose to begin PrEP, you should first be tested for HIV. You should then be tested every 3 months for as long as you are taking PrEP.  PREGNANCY   If you are premenopausal and you may become pregnant, ask your health care provider about preconception counseling.  If you may become pregnant, take 400 to 800 micrograms (mcg) of folic acid every day.  If you want to prevent pregnancy, talk to your health care provider about birth control (contraception). OSTEOPOROSIS AND MENOPAUSE   Osteoporosis is a disease in which the bones lose minerals and strength with aging. This can result in serious bone fractures. Your risk for osteoporosis can be identified using a bone density scan.  If you are 56 years of age or older, or if you are at risk for osteoporosis and fractures, ask your health care provider if you should be screened.  Ask your health care provider whether you should take a calcium or vitamin D supplement to lower your risk for osteoporosis.  Menopause may have certain physical symptoms and risks.  Hormone replacement therapy may reduce some of these symptoms and risks. Talk to your health care provider about whether hormone replacement therapy is right for you.  HOME CARE INSTRUCTIONS   Schedule regular health, dental, and eye exams.  Stay current with your immunizations.   Do not use any tobacco products including cigarettes, chewing tobacco, or electronic cigarettes.  If you are pregnant, do not drink alcohol.  If you are breastfeeding, limit how much and how often you drink  alcohol.  Limit alcohol intake to no more than 1 drink per day for nonpregnant women. One drink equals 12 ounces of beer, 5 ounces of wine, or 1 ounces of hard liquor.  Do not use street drugs.  Do not share needles.  Ask your health care provider for help if you need support or information about quitting drugs.  Tell your health care provider if you often feel depressed.  Tell your health care provider if you have ever been abused or do not feel safe at home. Document Released: 08/22/2010 Document Revised: 06/23/2013 Document Reviewed: 01/08/2013 [facility] Patient Information 2015 [place], [place]. This information is not intended to replace advice given to you by your health care provider. Make sure you discuss any questions you have with your health care provider.

## 2014-04-24 NOTE — Assessment & Plan Note (Addendum)
1) Anticipatory Guidance: Discussed importance of wearing a seatbelt while driving and not texting while driving; changing batteries in smoke detector at least once annually; wearing suntan lotion when outside; eating a balanced and moderate diet; getting physical activity at least 30 minutes per day.  2) Immunizations / Screenings / Labs:  Tetanus shot given today. All other immunizations are up to date per recommendations. Patient is due for vision exam and PAP. Referrals placed for vision and number given for [facility]. Obtain CBC, BMET, Lipid profile and TSH.   Overall well exam. Patient has minimal risk factors at this time. She is overweight with the recommendation to lost 5-10% of her body weight. Increase nutrient density and physical activity. Follow up prevention exam in 1 year.

## 2014-04-24 NOTE — Progress Notes (Signed)
Pre visit review using our clinic review tool, if applicable. No additional management support is needed unless otherwise documented below in the visit note. 

## 2014-04-24 NOTE — Progress Notes (Signed)
Subjective:    Patient ID: Megan Ford, female    DOB: [birth year], 49 y.o.   MRN: [id number]  Chief Complaint  Patient presents with  . CPE    Not fasting    HPI:  Megan Ford is a 49 y.o. female who presents today for an annual wellness visit. She is present with a Optometrist.   1) Health Maintenance -   Diet - averages 2 meals a day; Mostly [place] diet of rice, vegetables and fish; few fruits.   Exercise -walks on a stationary machine sporadically, with no structured plan.   2) Preventative Exams / Immunizations:  Dental --up to date Vision --due for exam  Health Maintenance  Topic Date Due  . HIV Screening  02/19/1983  . PAP SMEAR  02/18/1986  . TETANUS/TDAP  02/19/1987  . INFLUENZA VACCINE  09/21/2014     Immunization History  Administered Date(s) Administered  . Influenza,inj,Quad PF,36+ Mos 01/15/2013    No Known Allergies   Current Outpatient Prescriptions on File Prior to Visit  Medication Sig Dispense Refill  . ibuprofen (ADVIL,MOTRIN) 600 MG tablet Take 1 tablet (600 mg total) by mouth every 8 (eight) hours as needed. 30 tablet 0  . pantoprazole (PROTONIX) 40 MG tablet Take 1 tablet (40 mg total) by mouth daily. 30 tablet 3   No current facility-administered medications on file prior to visit.    Past Medical History  Diagnosis Date  . GERD (gastroesophageal reflux disease)   . Ovarian cyst Endometriois    Past Surgical History  Procedure Laterality Date  . Laparoscopy N/A 01/09/2013    Procedure: LAPAROSCOPY DIAGNOSTIC;  Surgeon: Woodroe Mode, MD;  Location: [facility];  Service: Gynecology;  Laterality: N/A;  . Carpal tunnel release Left 06/10/2013    Procedure: LEFT CARPAL TUNNEL RELEASE;  Surgeon: Cammie Sickle., MD;  Location: [place];  Service: Orthopedics;  Laterality: Left;    Family History  Problem Relation Age of Onset  . Asthma Father     History   Social History  . Marital Status: Married   Spouse Name: N/A  . Number of Children: N/A  . Years of Education: N/A   Occupational History  . Not on file.   Social History Main Topics  . Smoking status: Never Smoker   . Smokeless tobacco: Never Used  . Alcohol Use: No  . Drug Use: No  . Sexual Activity: Yes   Other Topics Concern  . Not on file   Social History Narrative    Review of Systems  Constitutional: Denies fever, chills, fatigue, or significant weight gain/loss. HENT: Head: Denies headache or neck pain Ears: Denies changes in hearing, ringing in ears, earache, drainage Nose: Denies discharge, stuffiness, itching, nosebleed, sinus pain Throat: Denies sore throat, hoarseness, dry mouth, sores, thrush Eyes: Denies loss/changes in vision, pain, redness, blurry/double vision, flashing lights Cardiovascular: Denies chest pain/discomfort, tightness, palpitations, shortness of breath with activity, difficulty lying down, swelling, sudden awakening with shortness of breath Respiratory: Denies shortness of breath, cough, sputum production, wheezing Gastrointestinal: Denies dysphasia, heartburn, change in appetite, nausea, change in bowel habits, rectal bleeding, constipation, diarrhea, yellow skin or eyes Genitourinary: Denies frequency, urgency, burning/pain, blood in urine, incontinence, change in urinary strength. Musculoskeletal: Denies muscle/joint pain, stiffness, back pain, redness or swelling of joints, trauma Skin: Denies rashes, lumps, itching, dryness, color changes, or hair/nail changes Neurological: Denies dizziness, fainting, seizures, weakness, numbness, tingling, tremor Psychiatric - Denies nervousness, stress, depression or memory loss Endocrine:  Denies heat or cold intolerance, sweating, frequent urination, excessive thirst, changes in appetite Hematologic: Denies ease of bruising or bleeding     Objective:    BP 136/82 mmHg  Pulse 69  Temp(Src) 97.9 F (36.6 C) (Oral)  Resp 18  Ht 5\' 1"  (1.549 m)   Wt 145 lb (65.772 kg)  BMI 27.41 kg/m2  SpO2 97% Nursing note and vital signs reviewed.  Physical Exam  Constitutional: She is oriented to person, place, and time. She appears well-developed and well-nourished.  HENT:  Head: Normocephalic.  Right Ear: Hearing, tympanic membrane, external ear and ear canal normal.  Left Ear: Hearing, tympanic membrane, external ear and ear canal normal.  Nose: Nose normal.  Mouth/Throat: Uvula is midline, oropharynx is clear and moist and mucous membranes are normal.  Eyes: Conjunctivae and EOM are normal. Pupils are equal, round, and reactive to light.  Neck: Neck supple. No JVD present. No tracheal deviation present. No thyromegaly present.  Cardiovascular: Normal rate, regular rhythm, normal heart sounds and intact distal pulses.   Pulmonary/Chest: Effort normal and breath sounds normal.  Abdominal: Soft. Bowel sounds are normal. She exhibits no distension and no mass. There is no tenderness. There is no rebound and no guarding.  Musculoskeletal: Normal range of motion. She exhibits no edema or tenderness.  Lymphadenopathy:    She has no cervical adenopathy.  Neurological: She is alert and oriented to person, place, and time. She has normal reflexes. No cranial nerve deficit. She exhibits normal muscle tone. Coordination normal.  Skin: Skin is warm and dry.  Psychiatric: She has a normal mood and affect. Her behavior is normal. Judgment and thought content normal.       Assessment & Plan:

## 2014-04-24 NOTE — Addendum Note (Signed)
Addended by: Delice Bison E on: 04/24/2014 04:52 PM   Modules accepted: Orders

## 2014-04-25 IMAGING — US US ART/VEN ABD/PELV/SCROTUM DOPPLER LTD
1 series · 9 of 9 positions shown · non-contrast
Comparison: None.

CLINICAL DATA: Bilateral ovarian masses.

EXAM:
DOPPLER ULTRASOUND OF OVARIES
TECHNIQUE: Color and duplex Doppler ultrasound was utilized to evaluate blood
flow to the ovaries.

[Series 1: us art/ven abd/pelv/scrotum doppler ltd · 0.20mm/px · 9 of 9 slices shown]
[im 1/9]
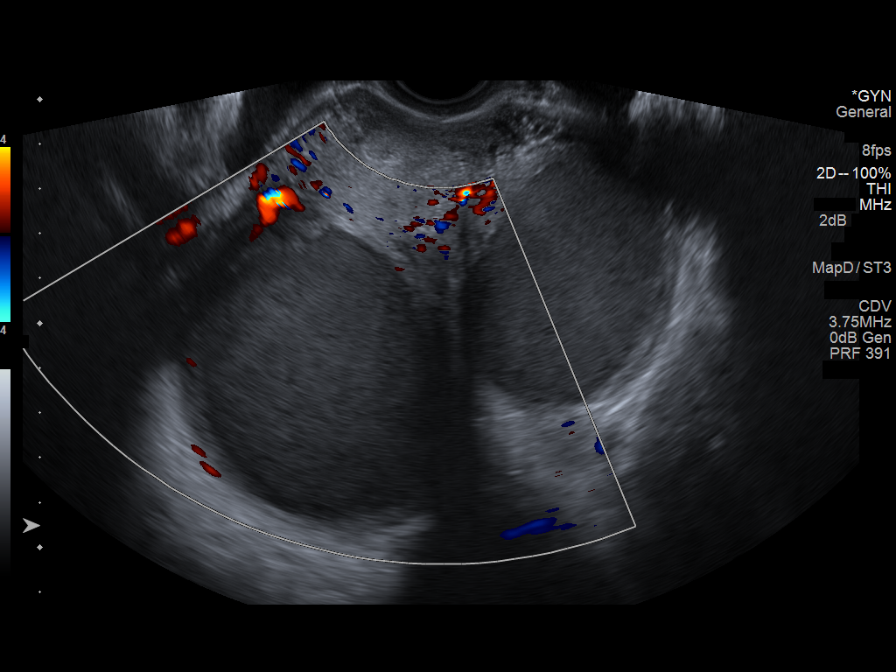
[im 2/9]
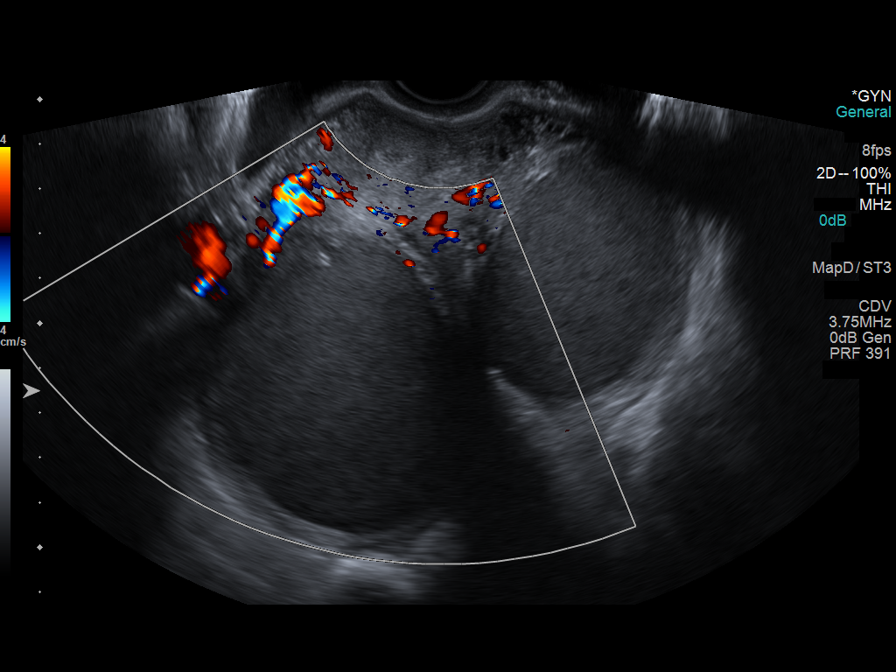
[im 3/9]
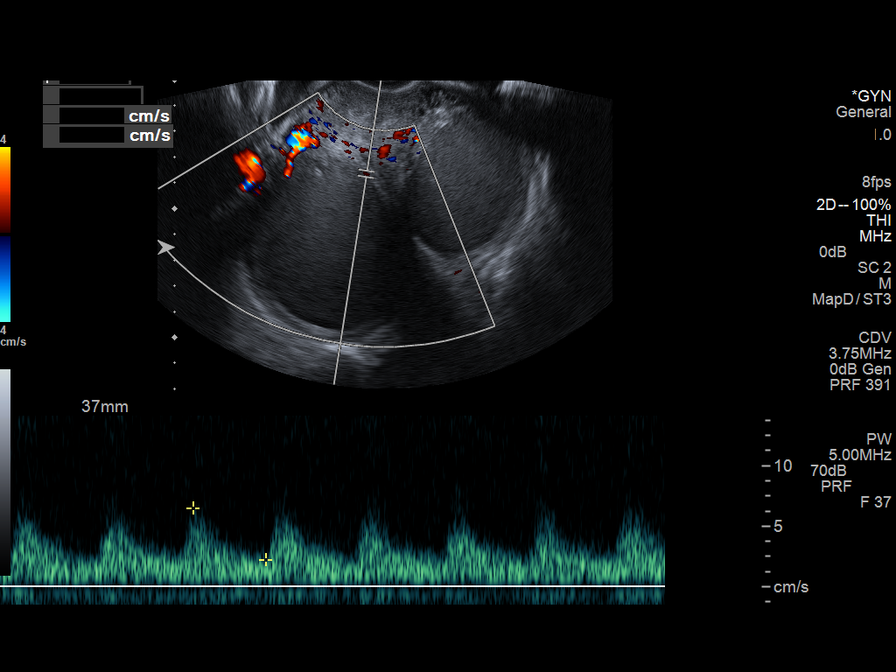
[im 4/9]
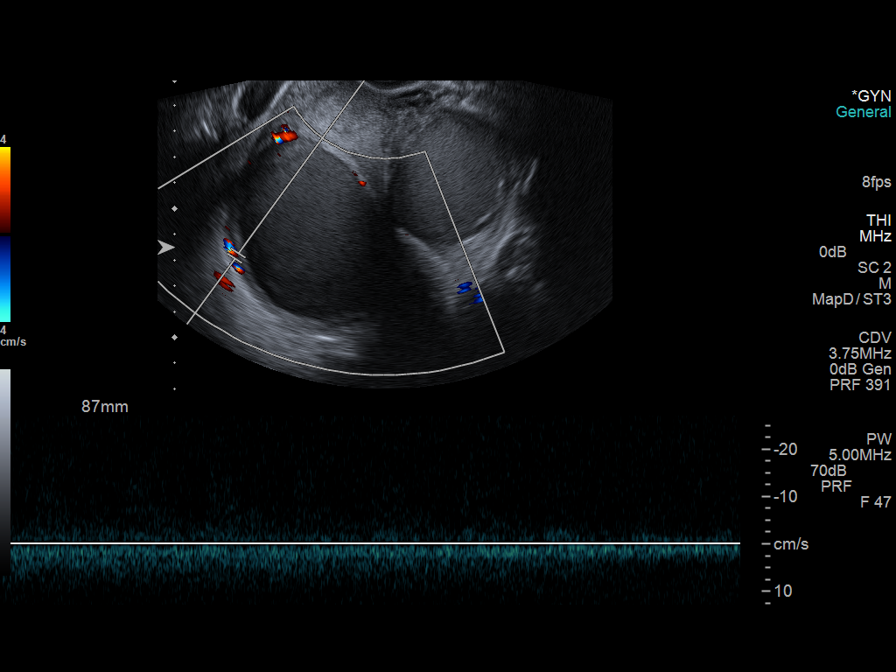
[im 5/9]
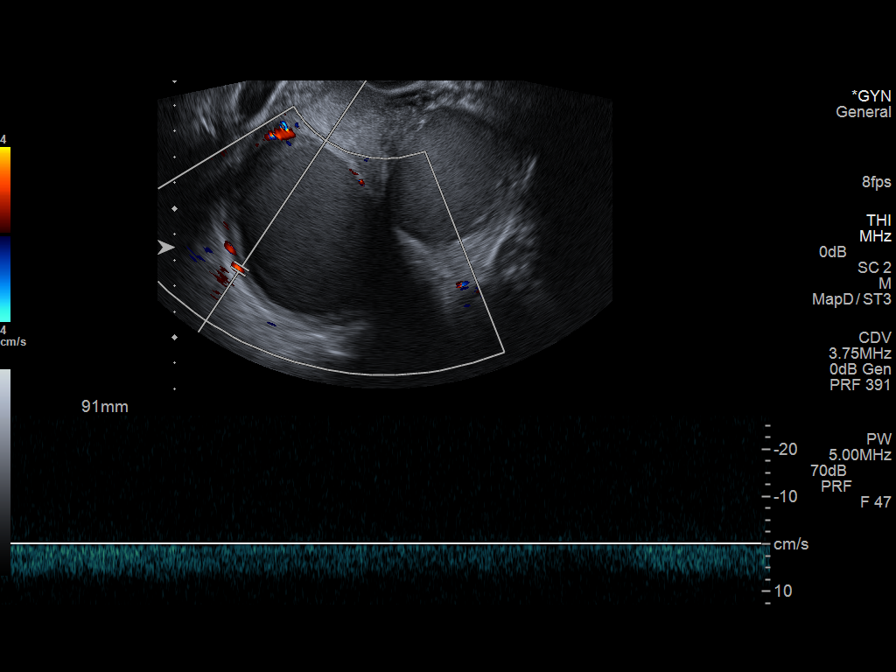
[im 6/9]
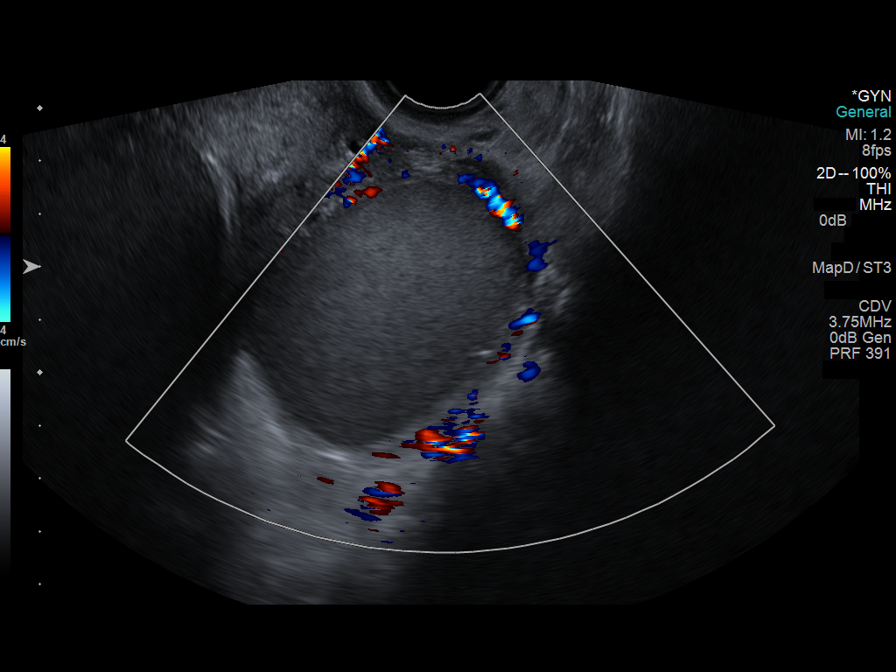
[im 7/9]
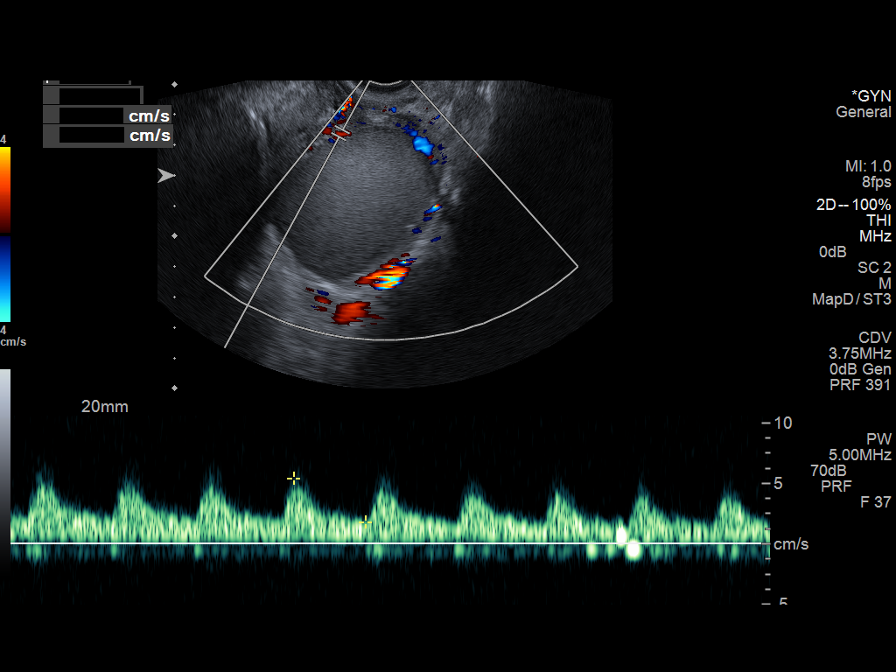
[im 8/9]
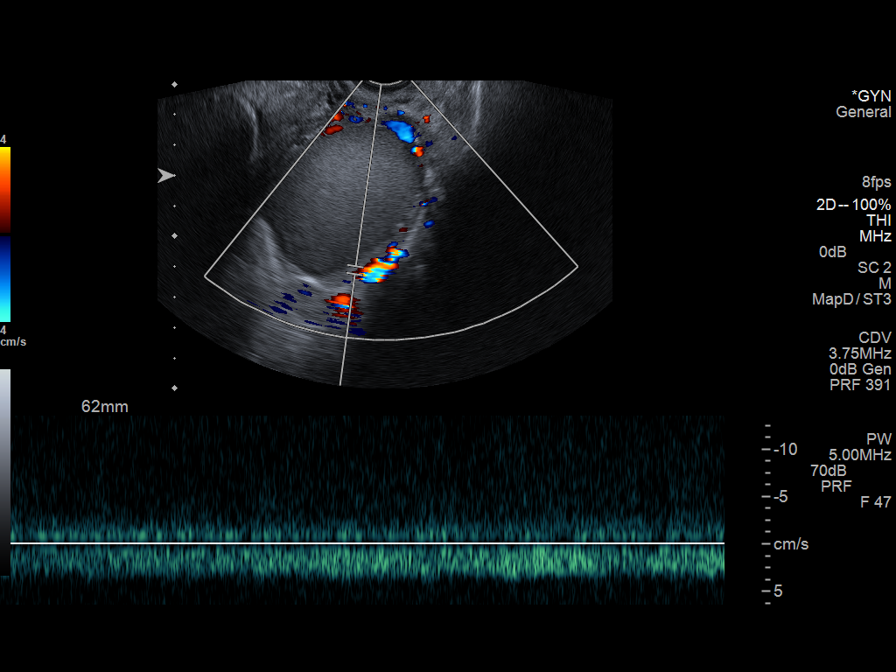
[im 9/9]
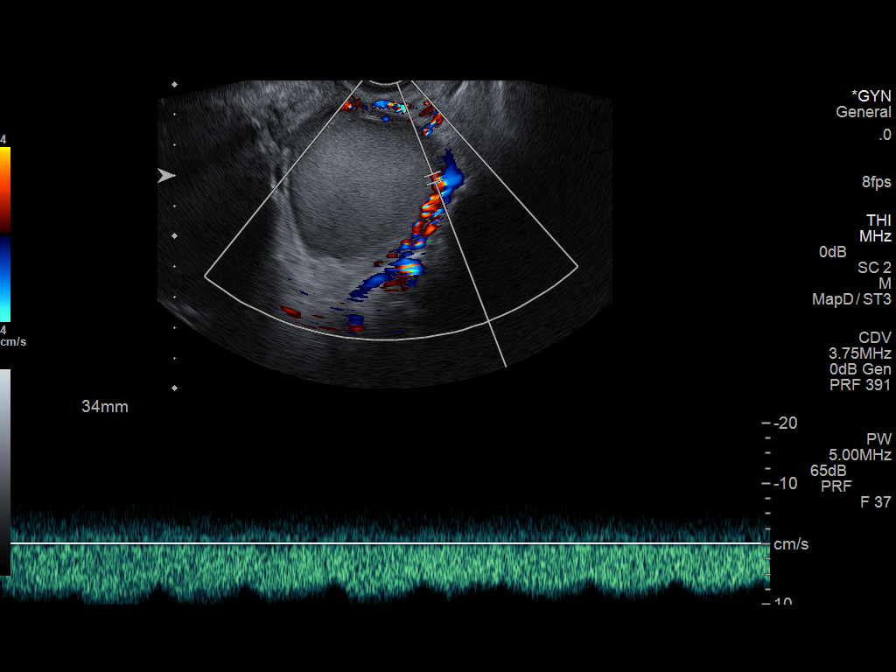

[9 of 9 positions shown; findings below may reference images not displayed]

FINDINGS: Pulsed Doppler evaluation of both ovaries demonstrates normal
low-resistance arterial and venous waveforms.
IMPRESSION: No evidence of active ovarian torsion.

## 2014-04-25 IMAGING — US US PELVIS COMPLETE
1 series · 13 of 25 positions shown · non-contrast
Comparison: 10/03/2012, CT abdomen 08/20/2012

CLINICAL DATA: Pelvic pain, uterine enlargement.



[Series 1: us pelvis complete · 0.24mm/px · 86 acquisitions, 13 frames shown]
[im 1/86]
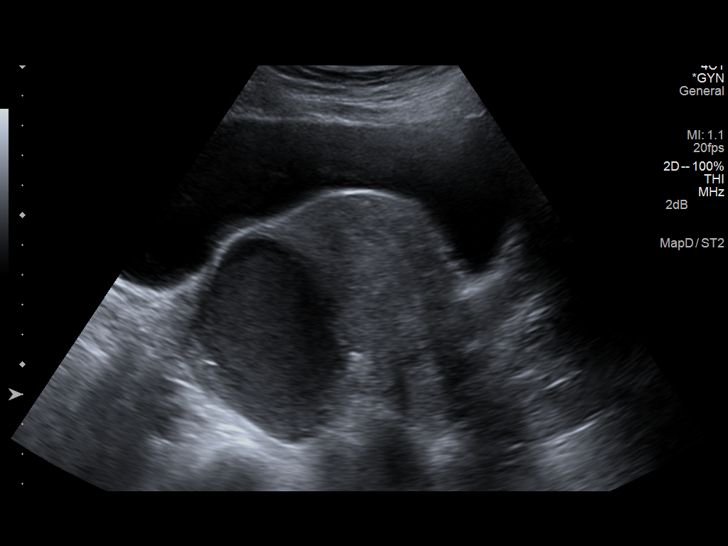
[im 8/86]
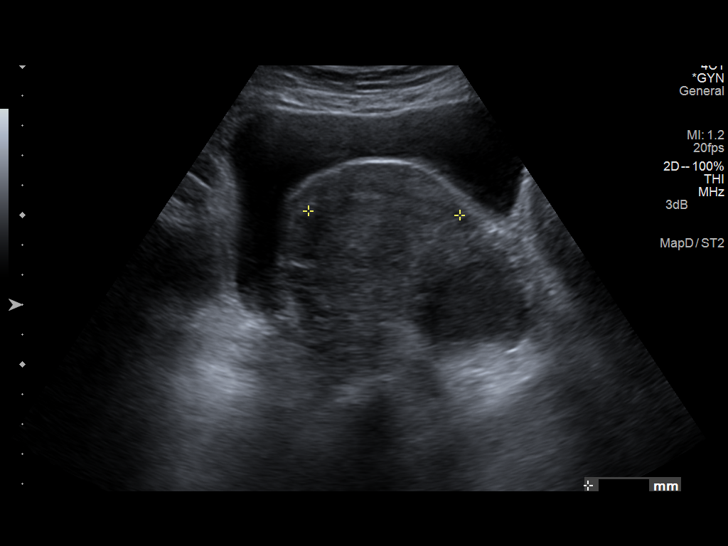
[im 15/86]
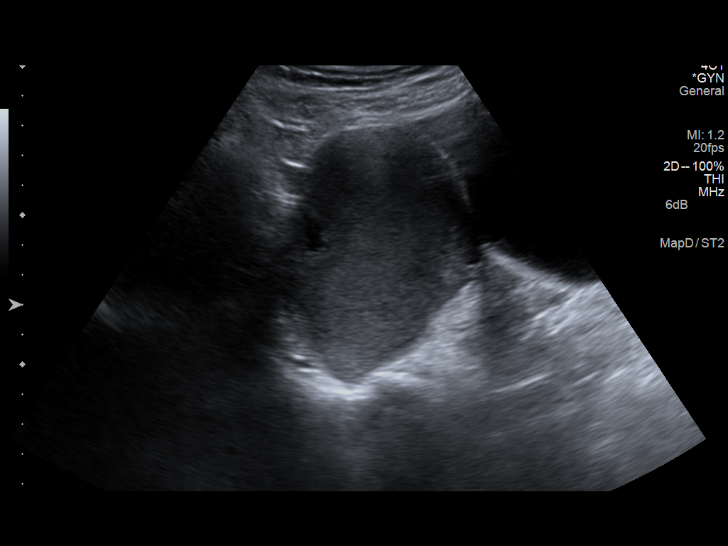
[im 22/86]
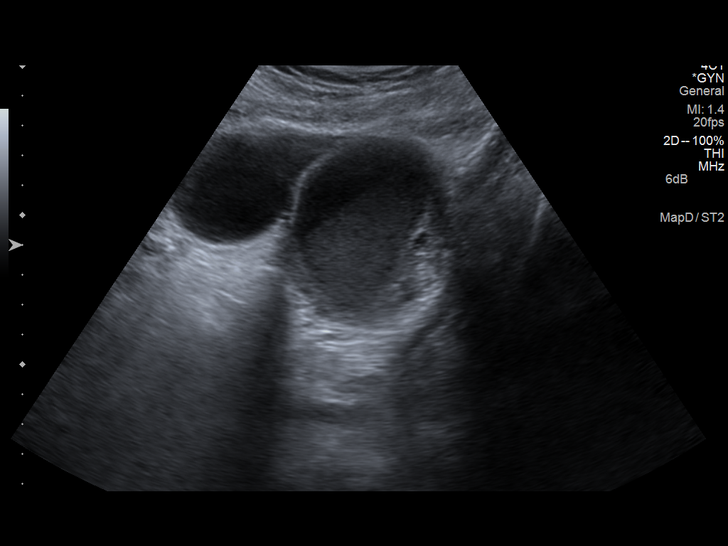
[im 29/86]
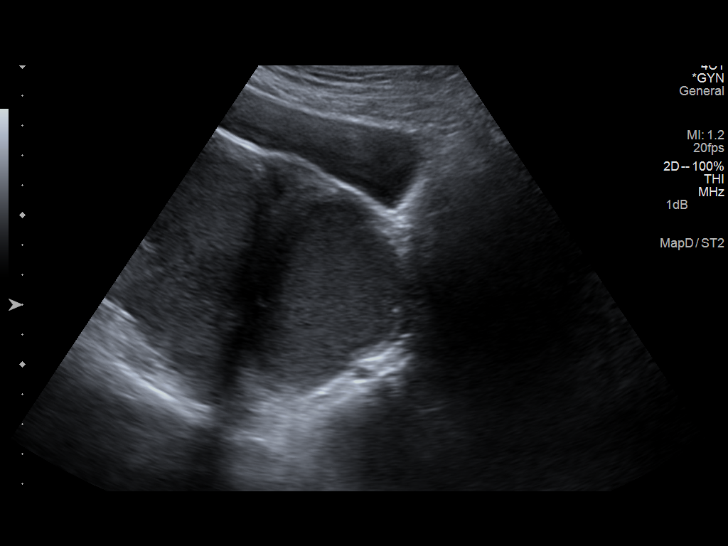
[im 36/86]
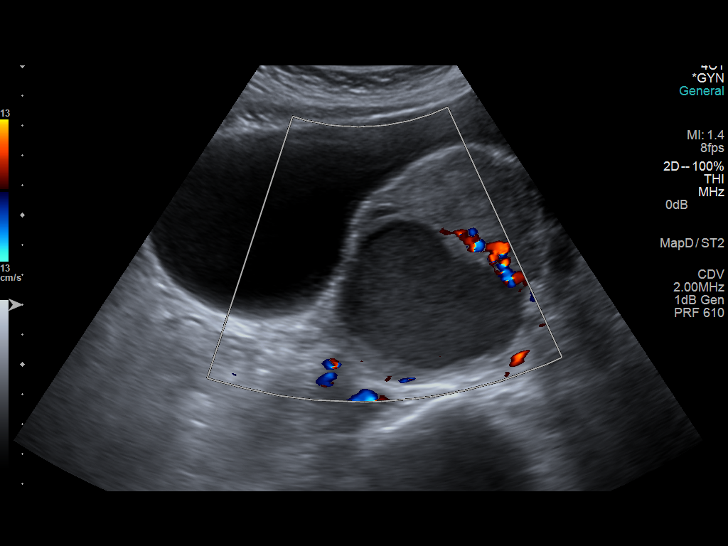
[im 43/86]
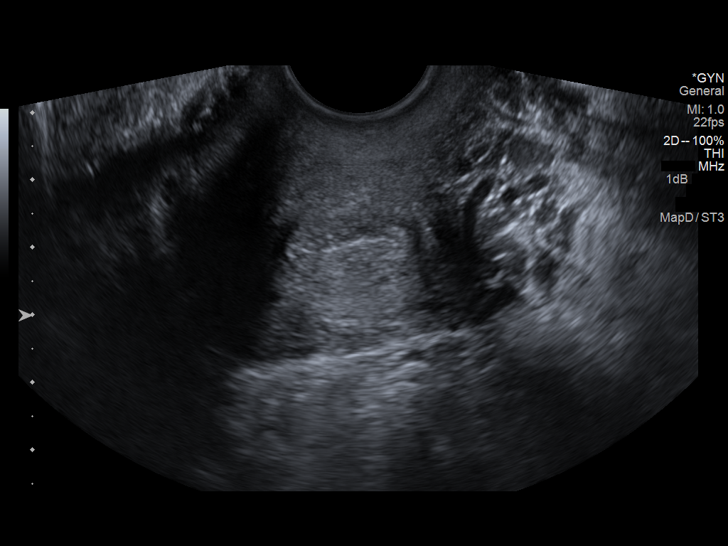
[im 50/86]
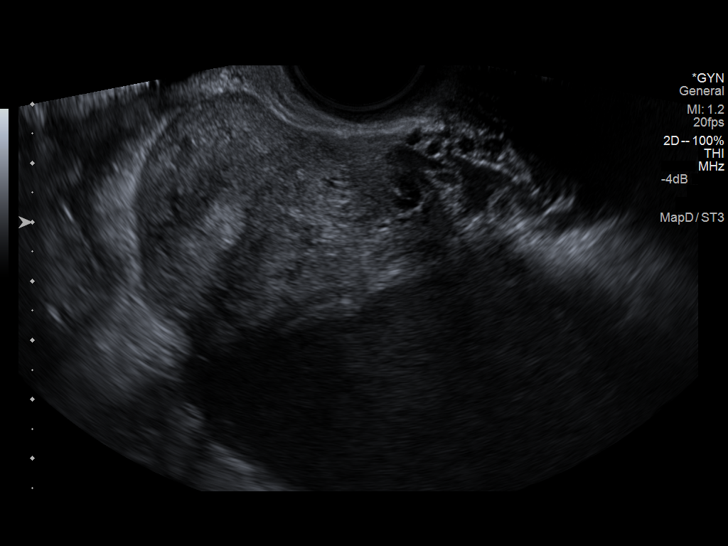
[im 57/86]
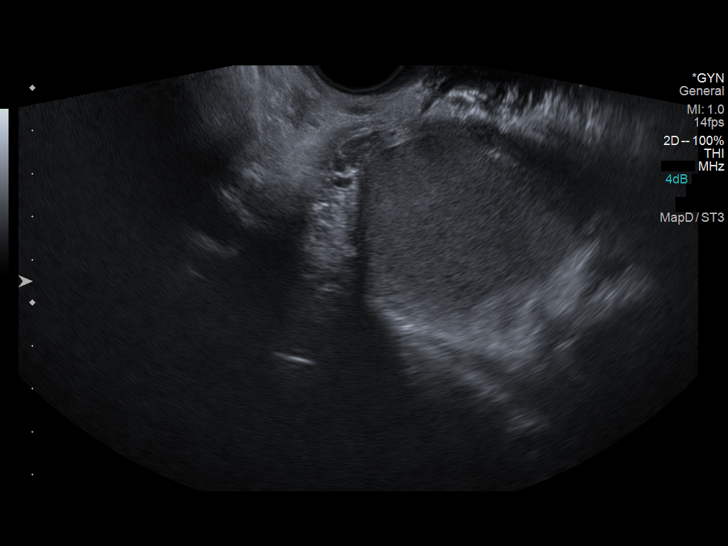
[im 64/86]
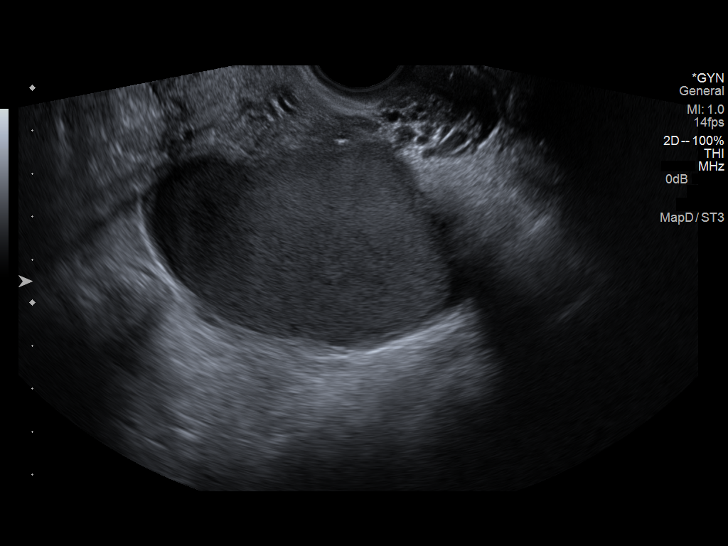
[im 71/86]
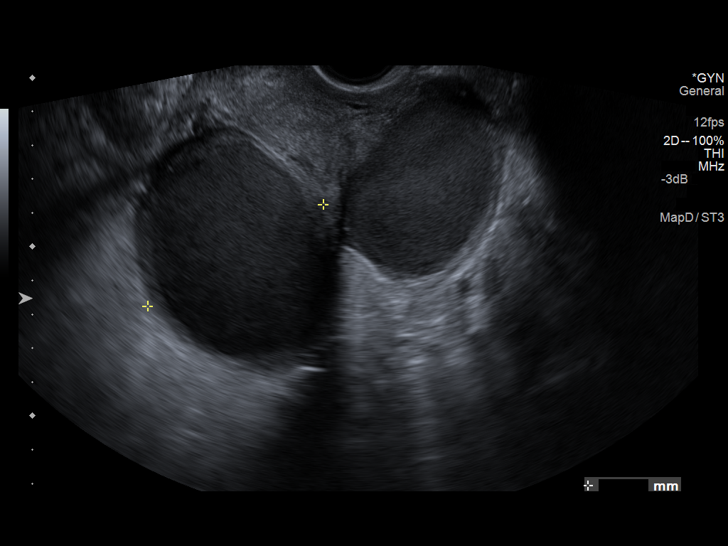
[im 78/86]
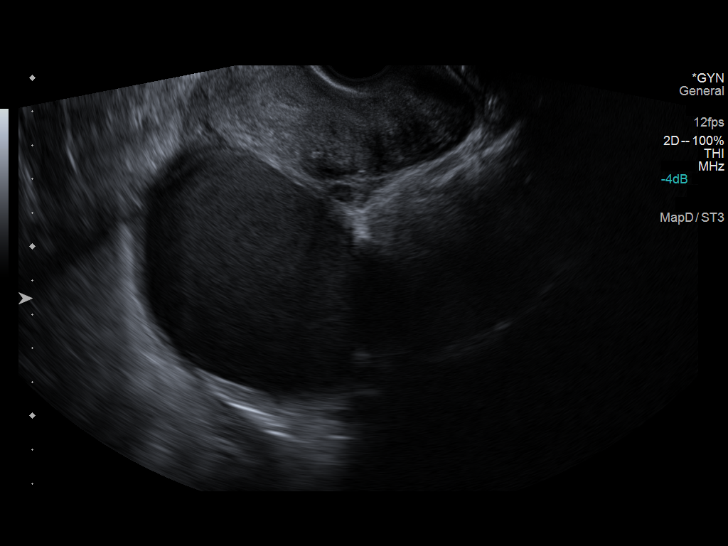
[im 86/86]
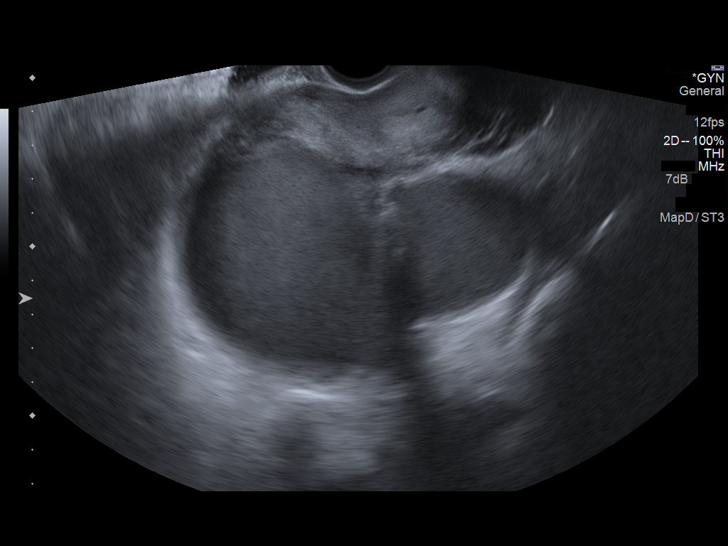

[13 of 25 positions shown; findings below may reference images not displayed]

FINDINGS: Uterus

Measurements: 9.1 x 4.1 x 4.8cm. No fibroids or other mass
visualized.

Endometrium

Thickness: 9.2 mm..  No focal abnormality visualized.

Right ovary

Measurements: 8.8 x 6.9 x 6 cm. There is a complex right ovarian
cystic mass measuring 7.7 x 6 x 5.2 cm which is homogeneously
hypoechoic. There is no internal Doppler flow. There is no mural
nodule.

Left ovary

Measurements: 8 x 5.3 x 5.3 cm.. There is a complex left ovarian
cystic mass measuring 6.2 x 4.6 x 4.5 cm which is homogeneously in
echotexture without internal Doppler flow or mural nodule.

Other findings

No free fluid.
IMPRESSION: Again noted are bilateral complex ovarian cystic masses without
significant interval change compared with the prior ultrasound dated
10/03/2012. The overall appearance is most concerning for bilateral
ovarian endometriomas.

## 2014-12-10 DIAGNOSIS — Z23 Encounter for immunization: Secondary | ICD-10-CM

## 2014-12-17 NOTE — Congregational Nurse Program (Signed)
Congregational Nurse Program Note  Date of Encounter: 12/10/2014  Past Medical History: Past Medical History  Diagnosis Date  . GERD (gastroesophageal reflux disease)   . Ovarian cyst Endometriois    Encounter Details:  Flu shot given on !0/20/2016

## 2016-05-17 ENCOUNTER — Ambulatory Visit (INDEPENDENT_AMBULATORY_CARE_PROVIDER_SITE_OTHER): Payer: BLUE CROSS/BLUE SHIELD | Admitting: Physician Assistant

## 2016-05-17 VITALS — BP 124/86 | HR 80 | Temp 98.3°F | Resp 16 | Ht 61.0 in | Wt 148.4 lb

## 2016-05-17 DIAGNOSIS — J029 Acute pharyngitis, unspecified: Secondary | ICD-10-CM | POA: Diagnosis not present

## 2016-05-17 DIAGNOSIS — R1013 Epigastric pain: Secondary | ICD-10-CM

## 2016-05-17 DIAGNOSIS — Z789 Other specified health status: Secondary | ICD-10-CM

## 2016-05-17 DIAGNOSIS — Z603 Acculturation difficulty: Secondary | ICD-10-CM

## 2016-05-17 DIAGNOSIS — Z23 Encounter for immunization: Secondary | ICD-10-CM

## 2016-05-17 DIAGNOSIS — K219 Gastro-esophageal reflux disease without esophagitis: Secondary | ICD-10-CM

## 2016-05-17 LAB — POCT CBC
Granulocyte percent: 71.1 %G (ref 37–80)
HCT, POC: 39.5 % (ref 37.7–47.9)
Hemoglobin: 13.5 g/dL (ref 12.2–16.2)
Lymph, poc: 1.8 (ref 0.6–3.4)
MCH, POC: 27.1 pg (ref 27–31.2)
MCHC: 34.1 g/dL (ref 31.8–35.4)
MCV: 79.3 fL — AB (ref 80–97)
MID (cbc): 0.2 (ref 0–0.9)
MPV: 7.7 fL (ref 0–99.8)
POC Granulocyte: 4.9 (ref 2–6.9)
POC LYMPH PERCENT: 26 %L (ref 10–50)
POC MID %: 2.9 %M (ref 0–12)
Platelet Count, POC: 191 10*3/uL (ref 142–424)
RBC: 4.98 M/uL (ref 4.04–5.48)
RDW, POC: 13.6 %
WBC: 6.9 10*3/uL (ref 4.6–10.2)

## 2016-05-17 LAB — POCT RAPID STREP A (OFFICE): Rapid Strep A Screen: NEGATIVE

## 2016-05-17 MED ORDER — FAMOTIDINE 20 MG PO TABS: 20.0000 mg | ORAL_TABLET | Freq: Every day | ORAL | 3 refills | Status: DC

## 2016-05-17 MED ORDER — PANTOPRAZOLE SODIUM 40 MG PO TBEC: 40.0000 mg | DELAYED_RELEASE_TABLET | Freq: Every day | ORAL | 3 refills | Status: DC

## 2016-05-17 MED ORDER — MUCINEX DM MAXIMUM STRENGTH 60-1200 MG PO TB12: 1.0000 | ORAL_TABLET | Freq: Two times a day (BID) | ORAL | 1 refills | Status: DC

## 2016-05-17 NOTE — Progress Notes (Signed)
Megan Ford  MRN: [id number] DOB: 12-03-1965  PCP: Mauricio Po, FNP  Subjective:  Pt is a 51 year old female who presents to clinic for sore throat and cough. She speaks [place].  She is here today with her daughter who is interpreting for her.   GERD since 2011 - When she lays down "it feels like liquid is coming up". Cough when she lays down. SOB when she coughs a lot. C/o occasional heart burn.  Mucus from the throat x 3 months.  Denies chest pain, fever, chills, chest tightness Pt add that she has had these symptoms since she had surgery through her bellybutton in 2011.   She eats dinner at 9 pm and goes to sleep at 1 am. She cannot get to sleep due to her regurgitation discomfort   H/o GERD - she has been on Pantoprazole 40mg  qd, omperazole. Last dose 2015 Pt is a poor historian and cannot recall which medications she took and if they worked well for her  Sore throat started 2 days ago. Difficulty/pain swallowing.  Endorses some chills.  Review of Systems  Constitutional: Negative for chills, diaphoresis, fatigue and fever.  HENT: Positive for sore throat and trouble swallowing. Negative for congestion, postnasal drip, rhinorrhea, sinus pain and sinus pressure.   Respiratory: Positive for cough and shortness of breath. Negative for chest tightness and wheezing.   Cardiovascular: Negative for chest pain and palpitations.  Gastrointestinal: Positive for abdominal pain, nausea and vomiting. Negative for constipation and diarrhea.  Psychiatric/Behavioral: Positive for sleep disturbance.    Patient Active Problem List   Diagnosis Date Noted  . Routine general medical examination at a health care facility 04/24/2014  . GERD (gastroesophageal reflux disease) 03/20/2014  . Endometriosis of pelvis 01/09/2013  . Ovarian cyst   . Other and unspecified ovarian cyst 08/22/2012  . Pelvic pain in female 08/22/2012    Current Outpatient Prescriptions on File Prior to Visit    Medication Sig Dispense Refill  . ibuprofen (ADVIL,MOTRIN) 600 MG tablet Take 1 tablet (600 mg total) by mouth every 8 (eight) hours as needed. (Patient not taking: Reported on 05/17/2016) 30 tablet 0  . pantoprazole (PROTONIX) 40 MG tablet Take 1 tablet (40 mg total) by mouth daily. (Patient not taking: Reported on 05/17/2016) 30 tablet 3   No current facility-administered medications on file prior to visit.     No Known Allergies   Objective:  BP 124/86 (BP Location: Right Arm, Patient Position: Sitting, Cuff Size: Small)   Pulse 80   Temp 98.3 F (36.8 C) (Oral)   Resp 16   Ht 5\' 1"  (1.549 m)   Wt 148 lb 6.4 oz (67.3 kg)   SpO2 95%   BMI 28.04 kg/m   Physical Exam  Constitutional: She is oriented to person, place, and time and well-developed, well-nourished, and in no distress. No distress.  HENT:  Right Ear: Tympanic membrane normal.  Left Ear: Tympanic membrane normal.  Mouth/Throat: Mucous membranes are normal. Posterior oropharyngeal erythema present. No oropharyngeal exudate or posterior oropharyngeal edema.  Cardiovascular: Normal rate, regular rhythm and normal heart sounds.   Pulmonary/Chest: Effort normal and breath sounds normal.  Abdominal: Soft. Normal appearance and bowel sounds are normal. There is no tenderness.  Neurological: She is alert and oriented to person, place, and time. GCS score is 15.  Skin: Skin is warm and dry.  Psychiatric: Mood, memory, affect and judgment normal.  Vitals reviewed.  Results for orders placed or performed in visit on  05/17/16  POCT CBC  Result Value Ref Range   WBC 6.9 4.6 - 10.2 K/uL   Lymph, poc 1.8 0.6 - 3.4   POC LYMPH PERCENT 26.0 10 - 50 %L   MID (cbc) 0.2 0 - 0.9   POC MID % 2.9 0 - 12 %M   POC Granulocyte 4.9 2 - 6.9   Granulocyte percent 71.1 37 - 80 %G   RBC 4.98 4.04 - 5.48 M/uL   Hemoglobin 13.5 12.2 - 16.2 g/dL   HCT, POC 39.5 37.7 - 47.9 %   MCV 79.3 (A) 80 - 97 fL   MCH, POC 27.1 27 - 31.2 pg   MCHC  34.1 31.8 - 35.4 g/dL   RDW, POC 13.6 %   Platelet Count, POC 191 142 - 424 K/uL   MPV 7.7 0 - 99.8 fL  POCT rapid strep A  Result Value Ref Range   Rapid Strep A Screen Negative Negative    Assessment and Plan :  1. Gastroesophageal reflux disease without esophagitis 2. Epigastric pain - Dextromethorphan-Guaifenesin (MUCINEX DM MAXIMUM STRENGTH) 60-1200 MG TB12; Take 1 tablet by mouth every 12 (twelve) hours.  Dispense: 14 each; Refill: 1 - famotidine (PEPCID) 20 MG tablet; Take 1 tablet (20 mg total) by mouth at bedtime.  Dispense: 30 tablet; Refill: 3 - pantoprazole (PROTONIX) 40 MG tablet; Take 1 tablet (40 mg total) by mouth daily. take 30-60 minutes before the first meal of the day.  Dispense: 30 tablet; Refill: 3 - H. pylori breath test - Pt has been treated for GERD in the past, however cannot recall which medication she was prescribed. While taking her medication, it did not completely rid her of her symptoms. She does not show any alarm symptoms. GERD vs problem with LES vs H Pylori vs lifestyle. Advised pt to eat dinner earlier and do not lay down immediately after. Unsure how much pt education has been provided concerning reflux. Information printed out and discussed. RTC in 1 month for follow-up. Consider referral to GI for endoscopy if no improvement.   3. Acute pharyngitis, unspecified etiology 4. Sore throat - Culture, Group A Strep - POCT CBC - POCT rapid strep A - Neg rapid strep. Culture pending. Supportive care encouraged. RTC in 5-7 days if no improvement.  5. Need for prophylactic vaccination and inoculation against influenza - Flu Vaccine QUAD 36+ mos PF IM (Fluarix & Fluzone Quad PF)  6. Language barrier   Mercer Pod, PA-C  [facility] 05/17/2016 2:00 PM

## 2016-05-17 NOTE — Patient Instructions (Addendum)
Sore Throat: Your strep test is negative Drink plenty of water (64 oz/day) and get plenty of rest. Ibuprofen/tylenol for throat pain. Hot tea and cough drops can help. I will call you with result of your throat culture If your symptoms are not improving in 7 days, return to clinic.  GERD (REFLUX) can be treated with medication, but also with lifestyle changes including elevation of the head of your bed (ideally with 6 inch  bed blocks), smoking cessation, avoidance of late meals, excessive alcohol, and avoid fatty foods, chocolate, peppermint, spicy foods, food with high fat content, colas, red wine, and acidic juices such as orange juice.  NO MINT OR MENTHOL PRODUCTS SO NO COUGH DROPS   USE SUGARLESS CANDY INSTEAD (Jolley ranchers or Stover's or Life Savers) or even ice chips will also do - the key is to swallow to prevent all throat clearing. NO OIL BASED VITAMINS - use powdered substitutes. Try not to eat past 7 pm. (Don't lay down after meals and avoid meals two to three hours before bedtime)    Mucinex 1200 mg q every 12 hours   Protonix 33m - take 30-60 minutes before the first meal of the day.  (If this is the medication you had before, call me at the office and leave a message. I will change it for you).   Pepcid (famotidine) 253mone at bedtime.   Nasal saline spray for sinus symptoms.    Come back and see me in 1 month   Thank you for coming in today. I hope you feel we met your needs.  Feel free to call [facility] if you have any questions or further requests.  Please consider signing up for MyChart if you do not already have it, as this is a great way to communicate with me.  Best,  WhITT IndustriesPA-C

## 2016-05-18 LAB — H. PYLORI BREATH TEST: H. pylori UBiT: NEGATIVE

## 2016-05-20 LAB — CULTURE, GROUP A STREP: Strep A Culture: NEGATIVE

## 2016-05-24 ENCOUNTER — Ambulatory Visit: Payer: BLUE CROSS/BLUE SHIELD

## 2016-06-19 ENCOUNTER — Encounter: Payer: Self-pay | Admitting: Physician Assistant

## 2016-06-19 ENCOUNTER — Ambulatory Visit (INDEPENDENT_AMBULATORY_CARE_PROVIDER_SITE_OTHER): Payer: BLUE CROSS/BLUE SHIELD | Admitting: Physician Assistant

## 2016-06-19 VITALS — BP 117/76 | HR 80 | Temp 97.7°F | Resp 18 | Ht 60.08 in | Wt 146.4 lb

## 2016-06-19 DIAGNOSIS — R059 Cough, unspecified: Secondary | ICD-10-CM

## 2016-06-19 DIAGNOSIS — R05 Cough: Secondary | ICD-10-CM | POA: Diagnosis not present

## 2016-06-19 DIAGNOSIS — R1310 Dysphagia, unspecified: Secondary | ICD-10-CM

## 2016-06-19 DIAGNOSIS — K219 Gastro-esophageal reflux disease without esophagitis: Secondary | ICD-10-CM | POA: Diagnosis not present

## 2016-06-19 MED ORDER — BENZONATATE 100 MG PO CAPS: 100.0000 mg | ORAL_CAPSULE | Freq: Three times a day (TID) | ORAL | 0 refills | Status: DC | PRN

## 2016-06-19 NOTE — Patient Instructions (Addendum)
In the morning:  - pantoprazole (PROTONIX) 40 MG tablet; Take 1 tablet daily. take 30-60 minutes before the first meal of the day.    GI will call you to schedule an appointment.  If you can, try to eat dinner earlier than 9 o'clock at night. This will help reduce your cough at night.  Please see the below information to help with your reflux.   Thank you for coming in today. I hope you feel we met your needs.  Feel free to call [facility] if you have any questions or further requests.  Please consider signing up for MyChart if you do not already have it, as this is a great way to communicate with me.  Best,  Whitney McVey, PA-C   ? nng (Heartburn) ? nng l m?t lo?i ?au hay kh ch?u c th? x?y ra trong c? h?ng ho?c ng?c. N th??ng ???c m t? nh? m?t c?n ?au rt. N c?ng c th? gy ra m?t vi? ???ng trong mi?ng. ? nng c th? c?m th?y t?i t? h?n khi quy? vi? n?m xu?ng ho?c ci xu?ng, v n th??ng n?ng h?n vo ban ?m. ? nng c th? xa?y ra do ca?c th?? trong d? dy di chuy?n ng???c ln th?c qu?n (tra?o ng???c). H??NG D?N CH?M [place] T?I NH Th?c hi?n nh??ng hnh ??ng na?y ?? gi?m kh ch?u v gip trnh cc bi?n ch?ng. Ch? ?? ?n u?ng  Tun th? m?t ch? ?? ?n theo khuy?n ngh? c?a chuyn gia ch?m [place] s?c kh?e. Vi?c ny c th? l trnh cc th?c ?n v ?? u?ng nh?:  C ph v tr (c ho?c khng c caffeine).  ?? u?ng c ch?a r??u.  ?? u?ng t?ng l?c v ?? u?ng dng trong th? thao.  ?? u?ng c ga ho?c soda.  S c la v c ca.  B?c h v h??ng v? b?c h.  T?i v hnh.  C?i ng?a (Horseradish).  Cc th?c ?n nhi?u gia v? v a xt, bao g?m h?t tiu, b?t ?t, b?t ca ri, gi?m, n??c s?t cay, v n??c s?t barbecue.  N??c qu? ho?c qu? h? cam qut, ch?ng h?n nh? cam, chanh v chanh l cam.  Cc th?c ?n c c chua, nh? n??c x?t ??, ?t, n??c x?t salsa, v pizza km x?t ??Marland Kitchen  Th?c ?n chin v nhi?u ch?t bo, ch?ng h?n nh? bnh rn, khoai ty chin, khoai ty rn v n??c x?t nhi?u ch?t bo.  Th?t nhi?u  ch?t bo, ch?ng h?n nh? hot dog (bnh m k?p xc xch) v cc lo?i th?t ?? v tr?ng nhi?u m?, ch?ng h?n nh? th?t n?c l?ng, xc xch, gi?m bng v th?t l?n xng khi.  Nh?ng s?n ph?m b? s?a giu ch?t bo, nh? s?a nguyn kem, b? v pho mt kem.  ?n cc b?a nh?, th??ng xuyn thay v cc b?a no.  Trnh u?ng nhi?u n??c khi qu v? ?n.  Trnh ?n trong kho?ng 2-3 gi? tr??c khi ?i ng?.  Trnh n?m xu?ng ngay sau khi ?n.  Khng t?p th? d?c ngay sau khi ?n. H??ng d?n chung  Ch  ??n nh?ng thay ??i v? tri?u ch?ng c?a qu v?.  Ch? s? d?ng thu?c khng c?n k ??n v thu?c c?n k ??n theo ch? d?n c?a chuyn gia ch?m [place] s?c kh?e. Khng dng aspirin, ibuprofen, ho?c cc thu?c NSAID khc tr? khi chuyn gia ch?m [place] s?c kh?e c?a qu v? ba?o quy? vi? du?ng.  Khng s? d?ng b?t k? s?n ph?m thu?c  l no, bao g?m thu?c l d?ng ht, thu?c l d?ng nhai v thu?c l ?i?n t?. N?u qu v? c?n gip ?? ?? cai thu?c, hy h?i chuyn gia ch?m [place] s?c kh?e.  M?c qu?n o r?ng. Khng m?c ci g ch?t quanh eo m c th? t?o p l?c ln b?ng quy? vi?.  Nng cao (nng) ??u gi??ng c?a quy? vi? kho?ng 6 inch (15 cm).  C? g?ng gi?m c?ng th?ng, ch?ng h?n nh? t?p yoga ho?c thi?n. N?u qu v? c?n gip ?? ?? gi?m c?ng th?ng, hy h?i chuyn gia ch?m [place] s?c kh?e.  N?u qu v? th?a cn, hy gi?m cn n?ng v? m?c c l?i cho s?c kh?e c?a qu v?. Hy h?i chuyn gia ch?m [place] s?c kh?e ?? ???c h??ng d?n v? m?c tiu gi?m cn an ton.  Tun th? t?t c? cc cu?c h?n khm l?i theo  ki?n c?a chuyn gia ch?m [place] s?c kh?e. ?i?u ny c vai tr quan tr?ng. ?I KHM N?U:  Qu v? c cc tri?u ch?ng m?i.  Qu v? b? s?t cn khng r nguyn nhn.  Qu v? b? kh nu?t ho?c b? ?au khi nu?t.  Qu v? th? kh kh ho?c ho dai d?ng.  Cc tri?u ch?ng c?a qu v? khng c?i thi?n sau khi ???c ?i?u tr?Floyde Parkins? vi? bi? ? nng th??ng xuyn trong h?n hai tu?n. NGAY L?P T?C ?I KHM N?U:  Qu v? b? ?au ? cnh tay, c?, hm, r?ng ho?c l?ng.  Qu v? th?y ?? m? hi,  chng m?t ho?c chong vng.  Qu v? b? ?au ng?c ho?c th? d?c.  Qu v? nn v ch?t nn ra gi?ng nh? mu ho?c b c ph.  Phn c?a qu v? c mu ho?c mu ?en. Thng tin ny khng nh?m m?c ?ch thay th? cho l?i khuyn m chuyn gia ch?m [place] s?c kh?e ni v?i qu v?. Hy b?o ??m qu v? ph?i th?o lu?n b?t k? v?n ?? g m qu v? c v?i chuyn gia ch?m [place] s?c kh?e c?a qu v?. Document Released: 05/31/2015 Document Revised: 05/31/2015 Document Reviewed: 06/03/2014 Elsevier Interactive Patient Education  2017 [place] for Gastroesophageal Reflux Disease, Adult When you have gastroesophageal reflux disease (GERD), the foods you eat and your eating habits are very important. Choosing the right foods can help ease your discomfort. What guidelines do I need to follow?  Choose fruits, vegetables, whole grains, and low-fat dairy products.  Choose low-fat meat, fish, and poultry.  Limit fats such as oils, salad dressings, butter, nuts, and avocado.  Keep a food diary. This helps you identify foods that cause symptoms.  Avoid foods that cause symptoms. These may be different for everyone.  Eat small meals often instead of 3 large meals a day.  Eat your meals slowly, in a place where you are relaxed.  Limit fried foods.  Cook foods using methods other than frying.  Avoid drinking alcohol.  Avoid drinking large amounts of liquids with your meals.  Avoid bending over or lying down until 2-3 hours after eating. What foods are not recommended? These are some foods and drinks that may make your symptoms worse: Vegetables  Tomatoes. Tomato juice. Tomato and spaghetti sauce. Chili peppers. Onion and garlic. Horseradish. Fruits  Oranges, grapefruit, and lemon (fruit and juice). Meats  High-fat meats, fish, and poultry. This includes hot dogs, ribs, ham, sausage, salami, and bacon. Dairy  Whole milk and chocolate milk. Sour cream. Cream. Butter. Ice cream.  Cream  cheese. Drinks  Coffee and tea. Bubbly (carbonated) drinks or energy drinks. Condiments  Hot sauce. Barbecue sauce. Sweets/Desserts  Chocolate and cocoa. Donuts. Peppermint and spearmint. Fats and Oils  High-fat foods. This includes [place] fries and potato chips. Other  Vinegar. Strong spices. This includes black pepper, white pepper, red pepper, cayenne, curry powder, cloves, ginger, and chili powder. The items listed above may not be a complete list of foods and drinks to avoid. Contact your dietitian for more information.  This information is not intended to replace advice given to you by your health care provider. Make sure you discuss any questions you have with your health care provider. Document Released: 08/08/2011 Document Revised: 07/15/2015 Document Reviewed: 12/11/2012 Elsevier Interactive Patient Education  2017 [employer].

## 2016-06-19 NOTE — Progress Notes (Signed)
Megan Ford  MRN: [id number] DOB: [birth year]  PCP: Mauricio Po, FNP  Subjective:  Pt is a 51 year old female who presents to clinic for cough x 1 year.  She speaks [place].  She is here today with an interpreter who is interpreting for her.   She was here 3/28 for sore throat, GERD and cough. Negative H. Pylori test. Treated with Mucinex, Famotidine and Protonix 40mg . She was asked to RTC in 1 month if no improvement.  This treatment helped her reflux symptoms, however it did not help the cough. She c/o continued cough at night when she lays down. She is not taking Pepcid, as this is not helping her cough. She is taking Mucinex in the morning and another medication at bedtime.   She did not elevate the head of her bed. She sleeps on 3-4 pillows at night - this is no helping much. Endorsees bad taste in her mouth after she lays down. This is worse around 2am.   Denies abdominal pain, shob, chest pain, fever, chills, chest tightness, nausea, vomiting.  She is only eating soft foods now - she feels like solid foods "get stuck" in the middle of her chest. This has been going on for 2-3 years.  She eats rice, vegetables and meat.  Some [employer]. She does not eat spicy foods very much because it causes her heartburn.   She is still eating dinner at 9 pm and going to sleep at 1 am, although at her last OV she was encouraged to eat earlier. She cannot get to sleep due to her regurgitation discomfort   GERD since 2011 - When she lays down "it feels like liquid is coming up". Cough when she lays down. SOB when she coughs a lot. C/o occasional heart burn. Pt says she has had these symptoms since she had surgery through her bellybutton in 2011. H/o GERD - she has been on Pantoprazole 40mg  qd, omperazole. Last dose 2015 Pt is a poor historian and cannot recall which medications she took and if they worked well for her   Review of Systems  Constitutional: Positive for appetite change. Negative  for chills, fatigue and fever.  Respiratory: Positive for cough. Negative for chest tightness, shortness of breath and wheezing.   Cardiovascular: Negative for chest pain and palpitations.  Gastrointestinal: Negative for abdominal pain, diarrhea, nausea and vomiting.  Psychiatric/Behavioral: Positive for sleep disturbance.    Patient Active Problem List   Diagnosis Date Noted  . Routine general medical examination at a health care facility 04/24/2014  . GERD (gastroesophageal reflux disease) 03/20/2014  . Endometriosis of pelvis 01/09/2013  . Ovarian cyst   . Other and unspecified ovarian cyst 08/22/2012  . Pelvic pain in female 08/22/2012    Current Outpatient Prescriptions on File Prior to Visit  Medication Sig Dispense Refill  . Dextromethorphan-Guaifenesin (MUCINEX DM MAXIMUM STRENGTH) 60-1200 MG TB12 Take 1 tablet by mouth every 12 (twelve) hours. 14 each 1  . pantoprazole (PROTONIX) 40 MG tablet Take 1 tablet (40 mg total) by mouth daily. take 30-60 minutes before the first meal of the day. 30 tablet 3   No current facility-administered medications on file prior to visit.     No Known Allergies   Objective:  BP 117/76   Pulse 80   Temp 97.7 F (36.5 C) (Oral)   Resp 18   Ht 5' 0.08" (1.526 m)   Wt 146 lb 6.4 oz (66.4 kg)   LMP 06/18/2016 Comment: spotting  SpO2 95%   BMI 28.52 kg/m   Physical Exam  Constitutional: She is oriented to person, place, and time and well-developed, well-nourished, and in no distress. No distress.  HENT:  Mouth/Throat: Oropharynx is clear and moist and mucous membranes are normal. Dental caries present.  Cardiovascular: Normal rate, regular rhythm and normal heart sounds.   Abdominal: Normal appearance and bowel sounds are normal. There is no tenderness.  Neurological: She is alert and oriented to person, place, and time. GCS score is 15.  Skin: Skin is warm and dry.  Psychiatric: Mood, memory, affect and judgment normal.  Vitals  reviewed.  Assessment and Plan :  1. Cough 2. Dysphagia, unspecified type 3. Gastroesophageal reflux disease without esophagitis - Ambulatory referral to Gastroenterology - benzonatate (TESSALON) 100 MG capsule; Take 1-2 capsules (100-200 mg total) by mouth 3 (three) times daily as needed for cough.  Dispense: 40 capsule; Refill: 0 - Symptoms are not improving following GERD treatment with Mucinex, Pantoprazole and Pepcid. She may be taking Protonix at night. She is a poor historian vs language barrier. Printed out instructions with pt today when she should take Protonix, discussed through interpreter.  Advised pt to eat dinner earlier and do not lay down immediately after. Unsure how much pt education has been provided concerning reflux. - Suspect GERD vs problem with LES vs lifestyle. She is negative for H. Pylori last OV. She does not show any alarm symptoms. GERD information/food choices printed out and discussed. Will refer to GI for eval and possible endoscopy.    Mercer Pod, PA-C  [facility] 06/19/2016 11:08 AM

## 2017-04-25 ENCOUNTER — Ambulatory Visit: Payer: BLUE CROSS/BLUE SHIELD | Admitting: Family Medicine

## 2017-04-25 ENCOUNTER — Encounter: Payer: Self-pay | Admitting: Family Medicine

## 2017-04-25 VITALS — BP 127/80 | HR 142 | Temp 103.2°F | Ht 61.26 in | Wt 142.6 lb

## 2017-04-25 DIAGNOSIS — J101 Influenza due to other identified influenza virus with other respiratory manifestations: Secondary | ICD-10-CM

## 2017-04-25 DIAGNOSIS — R6889 Other general symptoms and signs: Secondary | ICD-10-CM | POA: Diagnosis not present

## 2017-04-25 LAB — POC INFLUENZA A&B (BINAX/QUICKVUE)
INFLUENZA B, POC: NEGATIVE
Influenza A, POC: POSITIVE — AB

## 2017-04-25 MED ORDER — OSELTAMIVIR PHOSPHATE 75 MG PO CAPS
75.0000 mg | ORAL_CAPSULE | Freq: Two times a day (BID) | ORAL | 0 refills | Status: DC
Start: 2017-04-25 — End: 2017-11-09

## 2017-04-25 MED ORDER — ACETAMINOPHEN 500 MG PO TABS
1000.0000 mg | ORAL_TABLET | Freq: Once | ORAL | Status: AC
  Administered 2017-04-25: 1000 mg via ORAL

## 2017-04-25 MED ORDER — GUAIFENESIN ER 600 MG PO TB12
600.0000 mg | ORAL_TABLET | Freq: Two times a day (BID) | ORAL | 0 refills | Status: DC
Start: 2017-04-25 — End: 2017-11-09

## 2017-04-25 NOTE — Progress Notes (Signed)
Chief Complaint  Patient presents with  . Sore Throat    sx been going on for the last 2 days  . Cough    has been going on for last 5 years while lying down. Has been last 2 days  . Headache    last 2 days   . Fever    last 2 days   . Heartburn    last 2 days  . Abdominal Pain    on the left side since 2001    HPI   Upper Respiratory Infection:  Onset 2 days ago Cough, headache, sore throat, fever, reflux, abdominal pain on the left side No nausea or vomiting Tried otc meds No improvement  No history of asthma Has history of GERD    Past Medical History:  Diagnosis Date  . GERD (gastroesophageal reflux disease)   . Ovarian cyst Endometriois    Current Outpatient Medications  Medication Sig Dispense Refill  . acetaminophen (TYLENOL) 325 MG tablet Take 650 mg by mouth every 6 (six) hours as needed for fever or headache.    . benzonatate (TESSALON) 100 MG capsule Take 1-2 capsules (100-200 mg total) by mouth 3 (three) times daily as needed for cough. 40 capsule 0  . Dextromethorphan-Guaifenesin (MUCINEX DM MAXIMUM STRENGTH) 60-1200 MG TB12 Take 1 tablet by mouth every 12 (twelve) hours. 14 each 1  . guaiFENesin (MUCINEX) 600 MG 12 hr tablet Take 1 tablet (600 mg total) by mouth 2 (two) times daily. 60 tablet 0  . oseltamivir (TAMIFLU) 75 MG capsule Take 1 capsule (75 mg total) by mouth 2 (two) times daily. 14 capsule 0  . pantoprazole (PROTONIX) 40 MG tablet Take 1 tablet (40 mg total) by mouth daily. take 30-60 minutes before the first meal of the day. (Patient not taking: Reported on 04/25/2017) 30 tablet 3   Current Facility-Administered Medications  Medication Dose Route Frequency Provider Last Rate Last Dose  . acetaminophen (TYLENOL) tablet 1,000 mg  1,000 mg Oral Once Forrest Moron, MD        Allergies: No Known Allergies  Past Surgical History:  Procedure Laterality Date  . CARPAL TUNNEL RELEASE Left 06/10/2013   Procedure: LEFT CARPAL TUNNEL RELEASE;   Surgeon: Cammie Sickle., MD;  Location: [facility];  Service: Orthopedics;  Laterality: Left;  . LAPAROSCOPY N/A 01/09/2013   Procedure: LAPAROSCOPY DIAGNOSTIC;  Surgeon: Woodroe Mode, MD;  Location: [facility];  Service: Gynecology;  Laterality: N/A;    Social History   Socioeconomic History  . Marital status: Married    Spouse name: None  . Number of children: None  . Years of education: None  . Highest education level: None  Social Needs  . Financial resource strain: None  . Food insecurity - worry: None  . Food insecurity - inability: None  . Transportation needs - medical: None  . Transportation needs - non-medical: None  Occupational History  . None  Tobacco Use  . Smoking status: Never Smoker  . Smokeless tobacco: Never Used  Substance and Sexual Activity  . Alcohol use: No  . Drug use: No  . Sexual activity: Yes  Other Topics Concern  . None  Social History Narrative  . None    Family History  Problem Relation Age of Onset  . Asthma Father      ROS Review of Systems See HPI Constitution: see HPI Skin: No rash or itching Eyes: no blurry vision, no double vision GU: no dysuria or  hematuria Neuro: no dizziness or headaches all others reviewed and negative   Objective: Vitals:   04/25/17 1617  BP: 127/80  Pulse: (!) 142  Temp: (!) 103.2 F (39.6 C)  TempSrc: Oral  Weight: 142 lb 9.6 oz (64.7 kg)  Height: 5' 1.26" (1.556 m)    Physical Exam General: alert, oriented, in NAD Head: normocephalic, atraumatic, no sinus tenderness Eyes: EOM intact, no scleral icterus or conjunctival injection Ears: TM clear bilaterally Nose: mucosa nonerythematous, nonedematous Throat: no pharyngeal exudate or erythema Lymph: no posterior auricular, submental or cervical lymph adenopathy Heart: normal rate, normal sinus rhythm, no murmurs Lungs: clear to auscultation bilaterally, no wheezing   Assessment and Plan Megan Ford was seen today for sore  throat, cough, headache, fever, heartburn and abdominal pain.  Diagnoses and all orders for this visit:  Flu-like symptoms -     POC Influenza A&B(BINAX/QUICKVUE)  Influenza A -     acetaminophen (TYLENOL) tablet 1,000 mg  Other orders -     oseltamivir (TAMIFLU) 75 MG capsule; Take 1 capsule (75 mg total) by mouth 2 (two) times daily. -     guaiFENesin (MUCINEX) 600 MG 12 hr tablet; Take 1 tablet (600 mg total) by mouth 2 (two) times daily.  Advised supportive care Sent in tamiflu for patient Sent in tamiflu prophylaxis for patient's spouse Drop let precautions reviewed  [place]

## 2017-04-25 NOTE — Patient Instructions (Addendum)
Take tamiflu two times a day for a week No greasy foods No dairy Lots of fluids   IF you received an x-ray today, you will receive an invoice from [facility]. Please contact [facility] at [phone] with questions or concerns regarding your invoice.   IF you received labwork today, you will receive an invoice from [facility]. Please contact [facility] at [phone] with questions or concerns regarding your invoice.   Our billing staff will not be able to assist you with questions regarding bills from these companies.  You will be contacted with the lab results as soon as they are available. The fastest way to get your results is to activate your My Chart account. Instructions are located on the last page of this paperwork. If you have not heard from [place] regarding the results in 2 weeks, please contact this office.     Influenza, Adult Influenza, more commonly known as "the flu," is a viral infection that primarily affects the respiratory tract. The respiratory tract includes organs that help you breathe, such as the lungs, nose, and throat. The flu causes many common cold symptoms, as well as a high fever and body aches. The flu spreads easily from person to person (is contagious). Getting a flu shot (influenza vaccination) every year is the best way to prevent influenza. What are the causes? Influenza is caused by a virus. You can catch the virus by:  Breathing in droplets from an infected person's cough or sneeze.  Touching something that was recently contaminated with the virus and then touching your mouth, nose, or eyes.  What increases the risk? The following factors may make you more likely to get the flu:  Not cleaning your hands frequently with soap and water or alcohol-based hand sanitizer.  Having close contact with many people during cold and flu season.  Touching your mouth, eyes, or nose without washing or sanitizing your hands first.  Not drinking  enough fluids or not eating a healthy diet.  Not getting enough sleep or exercise.  Being under a high amount of stress.  Not getting a yearly (annual) flu shot.  You may be at a higher risk of complications from the flu, such as a severe lung infection (pneumonia), if you:  Are over the age of 62.  Are pregnant.  Have a weakened disease-fighting system (immune system). You may have a weakened immune system if you: ? Have HIV or AIDS. ? Are undergoing chemotherapy. ? Aretaking medicines that reduce the activity of (suppress) the immune system.  Have a long-term (chronic) illness, such as heart disease, kidney disease, diabetes, or lung disease.  Have a liver disorder.  Are obese.  Have anemia.  What are the signs or symptoms? Symptoms of this condition typically last 4-10 days and may include:  Fever.  Chills.  Headache, body aches, or muscle aches.  Sore throat.  Cough.  Runny or congested nose.  Chest discomfort and cough.  Poor appetite.  Weakness or tiredness (fatigue).  Dizziness.  Nausea or vomiting.  How is this diagnosed? This condition may be diagnosed based on your medical history and a physical exam. Your health care provider may do a nose or throat swab test to confirm the diagnosis. How is this treated? If influenza is detected early, you can be treated with antiviral medicine that can reduce the length of your illness and the severity of your symptoms. This medicine may be given by mouth (orally) or through an IV tube that is inserted  in one of your veins. The goal of treatment is to relieve symptoms by taking care of yourself at home. This may include taking over-the-counter medicines, drinking plenty of fluids, and adding humidity to the air in your home. In some cases, influenza goes away on its own. Severe influenza or complications from influenza may be treated in a hospital. Follow these instructions at home:  Take over-the-counter and  prescription medicines only as told by your health care provider.  Use a cool mist humidifier to add humidity to the air in your home. This can make breathing easier.  Rest as needed.  Drink enough fluid to keep your urine clear or pale yellow.  Cover your mouth and nose when you cough or sneeze.  Wash your hands with soap and water often, especially after you cough or sneeze. If soap and water are not available, use hand sanitizer.  Stay home from work or school as told by your health care provider. Unless you are visiting your health care provider, try to avoid leaving home until your fever has been gone for 24 hours without the use of medicine.  Keep all follow-up visits as told by your health care provider. This is important. How is this prevented?  Getting an annual flu shot is the best way to avoid getting the flu. You may get the flu shot in late summer, fall, or winter. Ask your health care provider when you should get your flu shot.  Wash your hands often or use hand sanitizer often.  Avoid contact with people who are sick during cold and flu season.  Eat a healthy diet, drink plenty of fluids, get enough sleep, and exercise regularly. Contact a health care provider if:  You develop new symptoms.  You have: ? Chest pain. ? Diarrhea. ? A fever.  Your cough gets worse.  You produce more mucus.  You feel nauseous or you vomit. Get help right away if:  You develop shortness of breath or difficulty breathing.  Your skin or nails turn a bluish color.  You have severe pain or stiffness in your neck.  You develop a sudden headache or sudden pain in your face or ear.  You cannot stop vomiting. This information is not intended to replace advice given to you by your health care provider. Make sure you discuss any questions you have with your health care provider. Document Released: 02/04/2000 Document Revised: 07/15/2015 Document Reviewed: 12/01/2014 Elsevier  Interactive Patient Education  2017 [employer].     IF you received an x-ray today, you will receive an invoice from [facility]. Please contact [facility] at [phone] with questions or concerns regarding your invoice.   IF you received labwork today, you will receive an invoice from [place]. Please contact [facility] at [phone] with questions or concerns regarding your invoice.   Our billing staff will not be able to assist you with questions regarding bills from these companies.  You will be contacted with the lab results as soon as they are available. The fastest way to get your results is to activate your My Chart account. Instructions are located on the last page of this paperwork. If you have not heard from [place] regarding the results in 2 weeks, please contact this office.

## 2017-11-09 ENCOUNTER — Other Ambulatory Visit: Payer: Self-pay

## 2017-11-09 ENCOUNTER — Ambulatory Visit: Payer: BLUE CROSS/BLUE SHIELD | Admitting: Family Medicine

## 2017-11-09 ENCOUNTER — Encounter: Payer: Self-pay | Admitting: Family Medicine

## 2017-11-09 VITALS — BP 134/84 | HR 68 | Temp 98.1°F | Ht 61.0 in | Wt 136.6 lb

## 2017-11-09 DIAGNOSIS — K219 Gastro-esophageal reflux disease without esophagitis: Secondary | ICD-10-CM

## 2017-11-09 DIAGNOSIS — R1013 Epigastric pain: Secondary | ICD-10-CM | POA: Diagnosis not present

## 2017-11-09 DIAGNOSIS — R300 Dysuria: Secondary | ICD-10-CM

## 2017-11-09 LAB — POCT URINALYSIS DIP (MANUAL ENTRY)
Bilirubin, UA: NEGATIVE
Glucose, UA: NEGATIVE mg/dL
Ketones, POC UA: NEGATIVE mg/dL
Nitrite, UA: NEGATIVE
Protein Ur, POC: NEGATIVE mg/dL
Spec Grav, UA: 1.015 (ref 1.010–1.025)
Urobilinogen, UA: 0.2 E.U./dL
pH, UA: 7 (ref 5.0–8.0)

## 2017-11-09 LAB — POC MICROSCOPIC URINALYSIS (UMFC): Mucus: ABSENT

## 2017-11-09 MED ORDER — NITROFURANTOIN MONOHYD MACRO 100 MG PO CAPS: 100.0000 mg | ORAL_CAPSULE | Freq: Two times a day (BID) | ORAL | 0 refills | Status: DC

## 2017-11-09 MED ORDER — OMEPRAZOLE 20 MG PO CPDR: 20.0000 mg | DELAYED_RELEASE_CAPSULE | Freq: Two times a day (BID) | ORAL | 3 refills | Status: DC

## 2017-11-09 MED ORDER — OMEPRAZOLE 20 MG PO CPDR
20.0000 mg | DELAYED_RELEASE_CAPSULE | Freq: Two times a day (BID) | ORAL | 3 refills | Status: DC
Start: 2017-11-09 — End: 2017-11-09

## 2017-11-09 NOTE — Addendum Note (Signed)
Addended by: Rutherford Guys on: 11/09/2017 12:07 PM   Modules accepted: Orders

## 2017-11-09 NOTE — Patient Instructions (Signed)
° ° ° °  If you have lab work done today you will be contacted with your lab results within the next 2 weeks.  If you have not heard from us then please contact us. The fastest way to get your results is to register for My Chart. ° ° °IF you received an x-ray today, you will receive an invoice from [facility]. Please contact [facility] at [phone] with questions or concerns regarding your invoice.  ° °IF you received labwork today, you will receive an invoice from [facility]. Please contact [facility] at [phone] with questions or concerns regarding your invoice.  ° °Our billing staff will not be able to assist you with questions regarding bills from these companies. ° °You will be contacted with the lab results as soon as they are available. The fastest way to get your results is to activate your My Chart account. Instructions are located on the last page of this paperwork. If you have not heard from us regarding the results in 2 weeks, please contact this office. °  ° ° ° °

## 2017-11-09 NOTE — Progress Notes (Addendum)
9/20/201911:29 AM  Megan Ford 09/09/1965, 52 y.o. female [id number]  Chief Complaint  Patient presents with  . Sore Throat    reoccuring issue, feeling nsueous when she eats. Feels food is getting stuck in the throat.  Difficult to lay down for coughing.   . Rash    itching rash on the chest  . Dysuria    painful urination just started this morning    HPI:   Patient is a 51 y.o. female with past medical history significant for GERD who presents today for sore throat and cough  Sore throat and cough, constant, worse x 1 week Having pain with swallowing solid foods Feels food gets stuck, makes her nauseous and vomits Has similar sx with water slowing down her eating helps No blood or coffee ground +nasal congestion, PND + burning epigastric pain, belching - bloating Normal BM, denies any black tarry stools or bright stools No fever or chills No unintentional weight loss Denies h/o h pylori Triggered by hot peppers, rice with meat dipping in fish sauce with hot peppers, sometime fried foods Does well with milk She does not drink coffee In the past treated with protonix 40mg  (2018) helped only for a week Took ranitidine last night  Dysuria, frequency with just a bit of urine output Denies hematuria Started this morning  Has never been diagnosed UTI before  In person interpreter thru [facility] present  Fall Risk  04/25/2017 06/19/2016 05/17/2016  Falls in the past year? No No No     Depression screen [facility] 2/9 04/25/2017 06/19/2016 05/17/2016  Decreased Interest 0 0 0  Down, Depressed, Hopeless 0 0 0  PHQ - 2 Score 0 0 0    No Known Allergies  Prior to Admission medications   Medication Sig Start Date End Date Taking? Authorizing Provider  pantoprazole (PROTONIX) 40 MG tablet Take 1 tablet (40 mg total) by mouth daily. take 30-60 minutes before the first meal of the day. Patient not taking: Reported on 04/25/2017 05/17/16   McVey, Gelene Mink, PA-C    Past Medical  History:  Diagnosis Date  . GERD (gastroesophageal reflux disease)   . Ovarian cyst Endometriois    Past Surgical History:  Procedure Laterality Date  . CARPAL TUNNEL RELEASE Left 06/10/2013   Procedure: LEFT CARPAL TUNNEL RELEASE;  Surgeon: Cammie Sickle., MD;  Location: [place];  Service: Orthopedics;  Laterality: Left;  . LAPAROSCOPY N/A 01/09/2013   Procedure: LAPAROSCOPY DIAGNOSTIC;  Surgeon: Woodroe Mode, MD;  Location: [facility];  Service: Gynecology;  Laterality: N/A;    Social History   Tobacco Use  . Smoking status: Never Smoker  . Smokeless tobacco: Never Used  Substance Use Topics  . Alcohol use: No    Family History  Problem Relation Age of Onset  . Asthma Father     ROS   OBJECTIVE:  Blood pressure 134/84, pulse 68, temperature 98.1 F (36.7 C), temperature source Oral, height 5\' 1"  (1.549 m), weight 136 lb 9.6 oz (62 kg), SpO2 96 %. Body mass index is 25.81 kg/m.   Physical Exam  Constitutional: She is oriented to person, place, and time. She appears well-developed and well-nourished.  HENT:  Head: Normocephalic and atraumatic.  Right Ear: Hearing, tympanic membrane, external ear and ear canal normal.  Left Ear: Hearing, tympanic membrane, external ear and ear canal normal.  Nose: Mucosal edema present. No rhinorrhea. Right sinus exhibits no maxillary sinus tenderness and no frontal sinus tenderness. Left sinus  exhibits no maxillary sinus tenderness and no frontal sinus tenderness.  Mouth/Throat: Posterior oropharyngeal erythema present. No posterior oropharyngeal edema.  Eyes: Pupils are equal, round, and reactive to light. Conjunctivae and EOM are normal.  Neck: Neck supple.  Cardiovascular: Normal rate, regular rhythm and normal heart sounds. Exam reveals no gallop and no friction rub.  No murmur heard. Pulmonary/Chest: Effort normal and breath sounds normal. She has no wheezes. She has no rales.  Abdominal: Soft. Bowel sounds  are normal. There is no hepatosplenomegaly. There is tenderness in the right lower quadrant, suprapubic area and left lower quadrant. There is no CVA tenderness.  Musculoskeletal: She exhibits no edema.  Lymphadenopathy:    She has no cervical adenopathy.  Neurological: She is alert and oriented to person, place, and time.  Skin: Skin is warm and dry.  Psychiatric: She has a normal mood and affect.  Nursing note and vitals reviewed.   Results for orders placed or performed in visit on 11/09/17 (from the past 24 hour(s))  POCT urinalysis dipstick     Status: Abnormal   Collection Time: 11/09/17 11:38 AM  Result Value Ref Range   Color, UA yellow yellow   Clarity, UA cloudy (A) clear   Glucose, UA negative negative mg/dL   Bilirubin, UA negative negative   Ketones, POC UA negative negative mg/dL   Spec Grav, UA 1.015 1.010 - 1.025   Blood, UA small (A) negative   pH, UA 7.0 5.0 - 8.0   Protein Ur, POC negative negative mg/dL   Urobilinogen, UA 0.2 0.2 or 1.0 E.U./dL   Nitrite, UA Negative Negative   Leukocytes, UA Moderate (2+) (A) Negative  POCT Microscopic Urinalysis ([facility])     Status: Abnormal   Collection Time: 11/09/17 11:52 AM  Result Value Ref Range   WBC,UR,HPF,POC Many (A) None WBC/hpf   RBC,UR,HPF,POC None None RBC/hpf   Bacteria None None, Too numerous to count   Mucus Absent Absent   Epithelial Cells, UR Per Microscopy Few (A) None, Too numerous to count cells/hpf      ASSESSMENT and PLAN  1. Gastroesophageal reflux disease, esophagitis presence not specified Discussed supportive measures, new meds r/se/b and RTC precautions. Patient educational handout given. - H. pylori breath test  2. Dysuria - POCT urinalysis dipstick - POCT Microscopic Urinalysis ([facility]) - Urine Culture  Other orders - omeprazole (PRILOSEC) 20 MG capsule; Take 1 capsule (20 mg total) by mouth 2 (two) times daily before a meal. - nitrofurantoin, macrocrystal-monohydrate, (MACROBID) 100  MG capsule; Take 1 capsule (100 mg total) by mouth 2 (two) times daily.  Return in about 4 weeks (around 12/07/2017) for GERD.    Rutherford Guys, MD Primary Care at [place], [place] [address] Ph.  [phone] Fax [phone]

## 2017-11-10 LAB — URINE CULTURE

## 2017-11-13 LAB — H PYLORI, IGM, IGG, IGA AB
H pylori, IgM Abs: <9 units (ref 0.0–8.9)
H. pylori, IgA Abs: 10.6 units — ABNORMAL HIGH (ref 0.0–8.9)
H. pylori, IgG AbS: 2.13 Index Value — ABNORMAL HIGH (ref 0.00–0.79)

## 2017-12-04 DIAGNOSIS — H10503 Unspecified blepharoconjunctivitis, bilateral: Secondary | ICD-10-CM | POA: Diagnosis not present

## 2017-12-10 ENCOUNTER — Other Ambulatory Visit: Payer: Self-pay

## 2017-12-10 ENCOUNTER — Encounter: Payer: Self-pay | Admitting: Family Medicine

## 2017-12-10 ENCOUNTER — Ambulatory Visit: Payer: BLUE CROSS/BLUE SHIELD | Admitting: Family Medicine

## 2017-12-10 VITALS — BP 126/79 | HR 71 | Temp 98.1°F | Ht 61.0 in | Wt 138.0 lb

## 2017-12-10 DIAGNOSIS — R1310 Dysphagia, unspecified: Secondary | ICD-10-CM

## 2017-12-10 DIAGNOSIS — K219 Gastro-esophageal reflux disease without esophagitis: Secondary | ICD-10-CM | POA: Diagnosis not present

## 2017-12-10 MED ORDER — OMEPRAZOLE 20 MG PO CPDR: 20.0000 mg | DELAYED_RELEASE_CAPSULE | Freq: Two times a day (BID) | ORAL | 3 refills | Status: DC

## 2017-12-10 MED ORDER — FAMOTIDINE 20 MG PO TABS: 20.0000 mg | ORAL_TABLET | Freq: Every day | ORAL | 3 refills | Status: AC

## 2017-12-10 NOTE — Patient Instructions (Signed)
° ° ° °  If you have lab work done today you will be contacted with your lab results within the next 2 weeks.  If you have not heard from us then please contact us. The fastest way to get your results is to register for My Chart. ° ° °IF you received an x-ray today, you will receive an invoice from [facility]. Please contact [facility] at [phone] with questions or concerns regarding your invoice.  ° °IF you received labwork today, you will receive an invoice from [facility]. Please contact [facility] at [phone] with questions or concerns regarding your invoice.  ° °Our billing staff will not be able to assist you with questions regarding bills from these companies. ° °You will be contacted with the lab results as soon as they are available. The fastest way to get your results is to activate your My Chart account. Instructions are located on the last page of this paperwork. If you have not heard from us regarding the results in 2 weeks, please contact this office. °  ° ° ° °

## 2017-12-10 NOTE — Progress Notes (Signed)
10/21/201911:23 AM  Megan Ford 1965/04/07, 52 y.o. female [id number]  Chief Complaint  Patient presents with  . Gastroesophageal Reflux    no more nausea and vomiting, yet still feels there is something stuck in the throat. Some coughing but only while laying down    HPI:   Patient is a 52 y.o. female with past medical history significant for GERD who presents today for routine followup  Started on omeprazole 20mg  BID last visit h pylori showed past but no active infection Nausea and vomiting much better Still having sensation that food gets stuck in her chest She still has reflux and coughing when she lies down Appetite is good Has cut back on spicy foods  UTI resolved  Interpreter present for this vist  Fall Risk  12/10/2017 04/25/2017 06/19/2016 05/17/2016  Falls in the past year? No No No No     Depression screen [facility] 2/9 12/10/2017 04/25/2017 06/19/2016  Decreased Interest 0 0 0  Down, Depressed, Hopeless 0 0 0  PHQ - 2 Score 0 0 0    No Known Allergies  Prior to Admission medications   Medication Sig Start Date End Date Taking? Authorizing Provider  nitrofurantoin, macrocrystal-monohydrate, (MACROBID) 100 MG capsule Take 1 capsule (100 mg total) by mouth 2 (two) times daily. 11/09/17  Yes Rutherford Guys, MD  omeprazole (PRILOSEC) 20 MG capsule Take 1 capsule (20 mg total) by mouth 2 (two) times daily before a meal. 11/09/17  Yes Rutherford Guys, MD    Past Medical History:  Diagnosis Date  . GERD (gastroesophageal reflux disease)   . Ovarian cyst Endometriois    Past Surgical History:  Procedure Laterality Date  . CARPAL TUNNEL RELEASE Left 06/10/2013   Procedure: LEFT CARPAL TUNNEL RELEASE;  Surgeon: Cammie Sickle., MD;  Location: [place];  Service: Orthopedics;  Laterality: Left;  . LAPAROSCOPY N/A 01/09/2013   Procedure: LAPAROSCOPY DIAGNOSTIC;  Surgeon: Woodroe Mode, MD;  Location: [facility];  Service: Gynecology;  Laterality: N/A;     Social History   Tobacco Use  . Smoking status: Never Smoker  . Smokeless tobacco: Never Used  Substance Use Topics  . Alcohol use: No    Family History  Problem Relation Age of Onset  . Asthma Father     ROS Per hpi  OBJECTIVE:  Blood pressure 126/79, pulse 71, temperature 98.1 F (36.7 C), temperature source Oral, height 5\' 1"  (1.549 m), weight 138 lb (62.6 kg), last menstrual period 07/09/2017, SpO2 98 %. Body mass index is 26.07 kg/m.   Wt Readings from Last 3 Encounters:  12/10/17 138 lb (62.6 kg)  11/09/17 136 lb 9.6 oz (62 kg)  04/25/17 142 lb 9.6 oz (64.7 kg)    Physical Exam  Constitutional: She is oriented to person, place, and time. She appears well-developed and well-nourished.  HENT:  Head: Normocephalic and atraumatic.  Mouth/Throat: Mucous membranes are normal.  Eyes: Pupils are equal, round, and reactive to light. Conjunctivae and EOM are normal. No scleral icterus.  Neck: Neck supple.  Pulmonary/Chest: Effort normal.  Neurological: She is alert and oriented to person, place, and time.  Skin: Skin is warm and dry.  Psychiatric: She has a normal mood and affect.  Nursing note and vitals reviewed.   ASSESSMENT and PLAN  1. Gastroesophageal reflux disease, esophagitis presence not specified 2. Dysphagia, unspecified type Not controlled. Adding famotidine. Discussed LFM, incl elevation of HOB. Referring to GI to r/o structural causes.  - Ambulatory  referral to Gastroenterology  Other orders - famotidine (PEPCID) 20 MG tablet; Take 1 tablet (20 mg total) by mouth at bedtime. - omeprazole (PRILOSEC) 20 MG capsule; Take 1 capsule (20 mg total) by mouth 2 (two) times daily before a meal.  Return in about 3 months (around 03/12/2018) for GERD.    Rutherford Guys, MD Primary Care at [place], [place] [address] Ph.  [phone] Fax [phone]

## 2017-12-11 ENCOUNTER — Encounter: Payer: Self-pay | Admitting: Family Medicine

## 2017-12-11 ENCOUNTER — Encounter: Payer: Self-pay | Admitting: Gastroenterology

## 2017-12-18 DIAGNOSIS — H10503 Unspecified blepharoconjunctivitis, bilateral: Secondary | ICD-10-CM | POA: Diagnosis not present

## 2017-12-18 DIAGNOSIS — H02889 Meibomian gland dysfunction of unspecified eye, unspecified eyelid: Secondary | ICD-10-CM | POA: Diagnosis not present

## 2018-01-13 NOTE — Progress Notes (Addendum)
Referring Provider: Rutherford Guys, MD Primary Care Physician:  Rutherford Guys, MD   Reason for Consultation:  GERD and dysphagia   IMPRESSION:  GERD with nocturnal symptoms and cough despite daily PPI and H2 blocker therapy Dysphagia to solids and liquids Daily diclofenac ? History of H pylori No prior colon cancer screening  Ongoing reflux with nocturnal symptoms and cough despite daily PPI and H2 blocker therapy.  There is associated dysphasia to solids and liquids.  The differential includes erosive esophagitis, GERD related dysmotility, esophageal stricture, eosinophilic esophagitis, nonerosive reflux disease, other esophageal motility disorder.  Given his differential I am recommending that we maximize medical therapy and plan an EGD with both esophageal and gastric biopsies.  PLAN: Increase omeprazole to 20 mg BID x 8 weeks Famotidine 20 mg QHS Reviewed lifestyle modifications EGD with gastric and esophageal biopsies Screening colonoscopy  I consented the patient at the bedside today discussing the risks, benefits, and alternatives to endoscopic evaluation. In particular, we discussed the risks that include, but are not limited to, reaction to medication, cardiopulmonary compromise, bleeding requiring blood transfusion, aspiration resulting in pneumonia, perforation requiring surgery, and even death. We reviewed the risk of missed lesion including polyps or even cancer. The patient acknowledges these risks and asks that we proceed.  HPI: Megan Ford is a 52 y.o. female seen in consultation at the request of Dr. Pamella Pert for further evaluation of GERD and dysphagia. From [place]. In the [place] since 1994. She is accompanied by a translator who provides interpretation services.  History is obtained through the patient and review of EPIC.   She presents with several months of reflux.  There is associated nausea and vomiting. Ongoing sensation of food getting stuck  in her chest - both liquids and solids -at the level of the sternal notch. She feels like it's not digesting.  Frequent eructation.  Associated cough and brash when supine.  No cough when upright.  No odynophagia.  No abdominal pain.  Symptoms triggered by hot peppers, rice with meat dipping in fish sauce with hot peppers, sometime fried foods.  No change in bowel habits.  Good appetite.  No change in weight.  Taking omeprazole 20mg  daily although she does not think this is helped much.  She believes the famotidine 20 mg nightly has provided the most relief. No other associated symptoms. No identified exacerbating or relieving features.   History of h pylori noted in her chart but she is not aware of this diagnosis.  H pylori UbiT was negative 05/17/16.  She takes diclofenac 75 mg twice daily.  She denies use of other NSAIDs.  No prior endoscopy or colon cancer screening.  There is no other family history of gastrointestinal disease.    Past Medical History:  Diagnosis Date  . GERD (gastroesophageal reflux disease)   . Ovarian cyst Endometriois    Past Surgical History:  Procedure Laterality Date  . CARPAL TUNNEL RELEASE Left 06/10/2013   Procedure: LEFT CARPAL TUNNEL RELEASE;  Surgeon: Cammie Sickle., MD;  Location: [facility];  Service: Orthopedics;  Laterality: Left;  . LAPAROSCOPY N/A 01/09/2013   Procedure: LAPAROSCOPY DIAGNOSTIC;  Surgeon: Woodroe Mode, MD;  Location: [facility];  Service: Gynecology;  Laterality: N/A;    Current Outpatient Medications  Medication Sig Dispense Refill  . famotidine (PEPCID) 20 MG tablet Take 1 tablet (20 mg total) by mouth at bedtime. 30 tablet 3  . nitrofurantoin, macrocrystal-monohydrate, (MACROBID) 100 MG capsule  Take 1 capsule (100 mg total) by mouth 2 (two) times daily. 14 capsule 0  . omeprazole (PRILOSEC) 20 MG capsule Take 1 capsule (20 mg total) by mouth 2 (two) times daily before a meal. 60 capsule 3   No current  facility-administered medications for this visit.     Allergies as of 01/14/2018  . (No Known Allergies)    Family History  Problem Relation Age of Onset  . Asthma Father     Social History   Socioeconomic History  . Marital status: Married    Spouse name: Not on file  . Number of children: Not on file  . Years of education: Not on file  . Highest education level: Not on file  Occupational History  . Not on file  Social Needs  . Financial resource strain: Not on file  . Food insecurity:    Worry: Not on file    Inability: Not on file  . Transportation needs:    Medical: Not on file    Non-medical: Not on file  Tobacco Use  . Smoking status: Never Smoker  . Smokeless tobacco: Never Used  Substance and Sexual Activity  . Alcohol use: No  . Drug use: No  . Sexual activity: Yes  Lifestyle  . Physical activity:    Days per week: Not on file    Minutes per session: Not on file  . Stress: Not on file  Relationships  . Social connections:    Talks on phone: Not on file    Gets together: Not on file    Attends religious service: Not on file    Active member of club or organization: Not on file    Attends meetings of clubs or organizations: Not on file    Relationship status: Not on file  . Intimate partner violence:    Fear of current or ex partner: Not on file    Emotionally abused: Not on file    Physically abused: Not on file    Forced sexual activity: Not on file  Other Topics Concern  . Not on file  Social History Narrative  . Not on file    Review of Systems: 12 system ROS is negative except as noted above with the additions of sore throat and cough.  There were no vitals filed for this visit.  Physical Exam: Vital signs were reviewed. General:   Alert, well-nourished, pleasant and cooperative in NAD Head:  Normocephalic and atraumatic. Eyes:  Sclera clear, no icterus.   Conjunctiva pink. Mouth:  No deformity or lesions.   Neck:  Supple; no  thyromegaly. Lungs:  Clear throughout to auscultation.   No wheezes.  Heart:  Regular rate and rhythm; no murmurs Abdomen:  Soft, nontender, normal bowel sounds. No rebound or guarding. No hepatosplenomegaly Rectal:  Deferred  Msk:  Symmetrical without gross deformities. Extremities:  No gross deformities or edema. Neurologic:  Alert and  oriented x4;  grossly nonfocal Skin:  No rash or bruise. Psych:  Alert and cooperative. Normal mood and affect.   Burlin Mcnair L. Tarri Glenn Md, MPH [facility] 01/13/2018, 1:00 PM

## 2018-01-14 ENCOUNTER — Ambulatory Visit: Payer: BLUE CROSS/BLUE SHIELD | Admitting: Gastroenterology

## 2018-01-14 ENCOUNTER — Encounter: Payer: Self-pay | Admitting: Gastroenterology

## 2018-01-14 VITALS — BP 110/80 | HR 72 | Ht 61.0 in | Wt 138.4 lb

## 2018-01-14 DIAGNOSIS — Z8619 Personal history of other infectious and parasitic diseases: Secondary | ICD-10-CM

## 2018-01-14 DIAGNOSIS — R1310 Dysphagia, unspecified: Secondary | ICD-10-CM

## 2018-01-14 DIAGNOSIS — Z1211 Encounter for screening for malignant neoplasm of colon: Secondary | ICD-10-CM | POA: Diagnosis not present

## 2018-01-14 DIAGNOSIS — K219 Gastro-esophageal reflux disease without esophagitis: Secondary | ICD-10-CM | POA: Diagnosis not present

## 2018-01-14 MED ORDER — NA SULFATE-K SULFATE-MG SULF 17.5-3.13-1.6 GM/177ML PO SOLN: 1.0000 | ORAL | 0 refills | Status: DC

## 2018-01-14 NOTE — Patient Instructions (Addendum)
Please avoid caffeine, alcohol, chocolate, peppermints, citrus, onions, carbonated beverages, and tomato-based foods. Consume acid and spicy foods in moderation.  Eat smaller meals and avoid eating for several hours before exercise or sleep.  Try not to eat within 3 hours of going to bed. Avoid bedtime snacks.  Elevate the head of your bed with blocks. Work to maintain a health weight. Even five pounds can make a difference. Take your omeprazole 30 minutes before breakfast and before dinner. Continue to take your famotidine at nighttime.  I have recommended an upper endoscopy to evaluate your trouble swallowing.

## 2018-02-26 ENCOUNTER — Encounter: Payer: Self-pay | Admitting: Gastroenterology

## 2018-02-26 ENCOUNTER — Ambulatory Visit (AMBULATORY_SURGERY_CENTER): Payer: BLUE CROSS/BLUE SHIELD | Admitting: Gastroenterology

## 2018-02-26 ENCOUNTER — Ambulatory Visit: Payer: BLUE CROSS/BLUE SHIELD | Admitting: Family Medicine

## 2018-02-26 VITALS — BP 135/69 | HR 56 | Temp 98.4°F | Resp 19 | Ht 61.0 in | Wt 138.0 lb

## 2018-02-26 DIAGNOSIS — K3189 Other diseases of stomach and duodenum: Secondary | ICD-10-CM

## 2018-02-26 DIAGNOSIS — D129 Benign neoplasm of anus and anal canal: Secondary | ICD-10-CM

## 2018-02-26 DIAGNOSIS — B9681 Helicobacter pylori [H. pylori] as the cause of diseases classified elsewhere: Secondary | ICD-10-CM | POA: Diagnosis not present

## 2018-02-26 DIAGNOSIS — Z1211 Encounter for screening for malignant neoplasm of colon: Secondary | ICD-10-CM

## 2018-02-26 DIAGNOSIS — K621 Rectal polyp: Secondary | ICD-10-CM

## 2018-02-26 DIAGNOSIS — D122 Benign neoplasm of ascending colon: Secondary | ICD-10-CM

## 2018-02-26 DIAGNOSIS — K228 Other specified diseases of esophagus: Secondary | ICD-10-CM | POA: Diagnosis not present

## 2018-02-26 DIAGNOSIS — D128 Benign neoplasm of rectum: Secondary | ICD-10-CM

## 2018-02-26 DIAGNOSIS — K2970 Gastritis, unspecified, without bleeding: Secondary | ICD-10-CM

## 2018-02-26 DIAGNOSIS — K2950 Unspecified chronic gastritis without bleeding: Secondary | ICD-10-CM | POA: Diagnosis not present

## 2018-02-26 DIAGNOSIS — K219 Gastro-esophageal reflux disease without esophagitis: Secondary | ICD-10-CM

## 2018-02-26 MED ORDER — SODIUM CHLORIDE 0.9 % IV SOLN: 500.0000 mL | Freq: Once | INTRAVENOUS | Status: DC

## 2018-02-26 NOTE — Op Note (Signed)
[facility] Patient Name: Megan Ford Procedure Date: 02/26/2018 3:01 PM MRN: [id number] Endoscopist: Thornton Park MD, MD Age: 53 Referring MD:  Date of Birth: Apr 30, 1965 Gender: Female Account #: [id number] Procedure:                Upper GI endoscopy Indications:              Dysphagia, Suspected gastro-esophageal reflux                            disease Medicines:                See the Anesthesia note for documentation of the                            administered medications Procedure:                Pre-Anesthesia Assessment:                           - Prior to the procedure, a History and Physical                            was performed, and patient medications and                            allergies were reviewed. The patient's tolerance of                            previous anesthesia was also reviewed. The risks                            and benefits of the procedure and the sedation                            options and risks were discussed with the patient.                            All questions were answered, and informed consent                            was obtained. Prior Anticoagulants: The patient has                            taken no previous anticoagulant or antiplatelet                            agents. ASA Grade Assessment: II - A patient with                            mild systemic disease. After reviewing the risks                            and benefits, the patient was deemed in  satisfactory condition to undergo the procedure.                           After obtaining informed consent, the endoscope was                            passed under direct vision. Throughout the                            procedure, the patient's blood pressure, pulse, and                            oxygen saturations were monitored continuously. The                            Endoscope was introduced through the mouth, and                            advanced to the third part of duodenum. The upper                            GI endoscopy was accomplished without difficulty.                            The patient tolerated the procedure well. Scope In: Scope Out: Findings:                 The examined esophagus was normal. Biopsies were                            obtained from the proximal and distal esophagus                            with cold forceps for histology of suspected                            eosinophilic esophagitis.                           Diffuse mild inflammation characterized by                            erythema, friability and granularity was found in                            the gastric body. Biopsies were taken with a cold                            forceps for Helicobacter pylori testing. Estimated                            blood loss was minimal.                           The examined duodenum was normal.  The exam was otherwise without abnormality. Complications:            No immediate complications. Estimated blood loss:                            Minimal. Estimated Blood Loss:     Estimated blood loss was minimal. Impression:               - Normal esophagus. Biopsied.                           - Gastritis. Biopsied.                           - Normal examined duodenum.                           - The examination was otherwise normal. Recommendation:           - Discharge patient to home.                           - Resume regular diet.                           - Continue present medications.                           - Await pathology results. Thornton Park MD, MD 02/26/2018 3:37:24 PM This report has been signed electronically.

## 2018-02-26 NOTE — Progress Notes (Signed)
Report to PACU, RN, vss, BBS= Clear.  

## 2018-02-26 NOTE — Patient Instructions (Signed)
Information on polyps and gastritis given to you today.  Await pathology results.  YOU HAD AN ENDOSCOPIC PROCEDURE TODAY AT [facility]:   Refer to the procedure report that was given to you for any specific questions about what was found during the examination.  If the procedure report does not answer your questions, please call your gastroenterologist to clarify.  If you requested that your care partner not be given the details of your procedure findings, then the procedure report has been included in a sealed envelope for you to review at your convenience later.  YOU SHOULD EXPECT: Some feelings of bloating in the abdomen. Passage of more gas than usual.  Walking can help get rid of the air that was put into your GI tract during the procedure and reduce the bloating. If you had a lower endoscopy (such as a colonoscopy or flexible sigmoidoscopy) you may notice spotting of blood in your stool or on the toilet paper. If you underwent a bowel prep for your procedure, you may not have a normal bowel movement for a few days.  Please Note:  You might notice some irritation and congestion in your nose or some drainage.  This is from the oxygen used during your procedure.  There is no need for concern and it should clear up in a day or so.  SYMPTOMS TO REPORT IMMEDIATELY:   Following lower endoscopy (colonoscopy or flexible sigmoidoscopy):  Excessive amounts of blood in the stool  Significant tenderness or worsening of abdominal pains  Swelling of the abdomen that is new, acute  Fever of 100F or higher   Following upper endoscopy (EGD)  Vomiting of blood or coffee ground material  New chest pain or pain under the shoulder blades  Painful or persistently difficult swallowing  New shortness of breath  Fever of 100F or higher  Black, tarry-looking stools  For urgent or emergent issues, a gastroenterologist can be reached at any hour by calling [phone].   DIET:  We do  recommend a small meal at first, but then you may proceed to your regular diet.  Drink plenty of fluids but you should avoid alcoholic beverages for 24 hours.  ACTIVITY:  You should plan to take it easy for the rest of today and you should NOT DRIVE or use heavy machinery until tomorrow (because of the sedation medicines used during the test).    FOLLOW UP: Our staff will call the number listed on your records the next business day following your procedure to check on you and address any questions or concerns that you may have regarding the information given to you following your procedure. If we do not reach you, we will leave a message.  However, if you are feeling well and you are not experiencing any problems, there is no need to return our call.  We will assume that you have returned to your regular daily activities without incident.  If any biopsies were taken you will be contacted by phone or by letter within the next 1-3 weeks.  Please call us at [phone] if you have not heard about the biopsies in 3 weeks.    SIGNATURES/CONFIDENTIALITY: You and/or your care partner have signed paperwork which will be entered into your electronic medical record.  These signatures attest to the fact that that the information above on your After Visit Summary has been reviewed and is understood.  Full responsibility of the confidentiality of this discharge information lies with you and/or  your care-partner. 

## 2018-02-26 NOTE — Op Note (Signed)
[facility] Patient Name: Megan Ford Procedure Date: 02/26/2018 3:00 PM MRN: [id number] Endoscopist: Thornton Park MD, MD Age: 53 Referring MD:  Date of Birth: 1965-04-02 Gender: Female Account #: [id number] Procedure:                Colonoscopy Indications:              Screening for colorectal malignant neoplasm, This                            is the patient's first colonoscopy Medicines:                See the Anesthesia note for documentation of the                            administered medications Procedure:                Pre-Anesthesia Assessment:                           - Prior to the procedure, a History and Physical                            was performed, and patient medications and                            allergies were reviewed. The patient's tolerance of                            previous anesthesia was also reviewed. The risks                            and benefits of the procedure and the sedation                            options and risks were discussed with the patient.                            All questions were answered, and informed consent                            was obtained. Prior Anticoagulants: The patient has                            taken no previous anticoagulant or antiplatelet                            agents. ASA Grade Assessment: II - A patient with                            mild systemic disease. After reviewing the risks                            and benefits, the patient was deemed in  satisfactory condition to undergo the procedure.                           After obtaining informed consent, the colonoscope                            was passed under direct vision. Throughout the                            procedure, the patient's blood pressure, pulse, and                            oxygen saturations were monitored continuously. The                            Model PCF-[id number]  [id number]) scope was introduced                            through the anus and advanced to the the terminal                            ileum, with identification of the appendiceal                            orifice and IC valve. The colonoscopy was performed                            without difficulty. The patient tolerated the                            procedure well. The quality of the bowel                            preparation was excellent. Scope In: 3:16:01 PM Scope Out: 3:31:45 PM Scope Withdrawal Time: 0 hours 10 minutes 22 seconds  Total Procedure Duration: 0 hours 15 minutes 44 seconds  Findings:                 The perianal and digital rectal examinations were                            normal.                           A 3 mm polyp was found in the ascending colon. The                            polyp was sessile. The polyp was removed with a                            cold biopsy forceps. Resection and retrieval were                            complete. Estimated blood loss was minimal.  A 2 mm polyp was found in the rectum. The polyp was                            sessile. The polyp was removed with a cold biopsy                            forceps. Resection and retrieval were complete.                           The exam was otherwise without abnormality on                            direct and retroflexion views. Complications:            No immediate complications. Estimated blood loss:                            Minimal. Estimated Blood Loss:     Estimated blood loss was minimal. Impression:               - One 3 mm polyp in the ascending colon, removed                            with a cold biopsy forceps. Resected and retrieved.                           - One 2 mm polyp in the rectum, removed with a cold                            biopsy forceps. Resected and retrieved.                           - The examination was otherwise normal  on direct                            and retroflexion views. Recommendation:           - Discharge patient to home.                           - Resume regular diet today.                           - Continue present medications.                           - Await pathology results.                           - Repeat colonoscopy date to be determined after                            pending pathology results are reviewed for                            surveillance  based on pathology results. Thornton Park MD, MD 02/26/2018 3:39:38 PM This report has been signed electronically.

## 2018-02-26 NOTE — Progress Notes (Signed)
Called to room to assist during endoscopic procedure.  Patient ID and intended procedure confirmed with present staff. Received instructions for my participation in the procedure from the performing physician.  

## 2018-02-26 NOTE — Progress Notes (Signed)
intrepreter -Vung Ksor

## 2018-02-27 ENCOUNTER — Telehealth: Payer: Self-pay

## 2018-02-27 NOTE — Telephone Encounter (Signed)
  Follow up Call-  Call back number 02/26/2018  Post procedure Call Back phone  # [phone]- husband(speaks a little English)  Permission to leave phone message Yes  Some recent data might be hidden     Patient questions:  Do you have a fever, pain , or abdominal swelling? Yes.   Pain Score  0 *  Have you tolerated food without any problems? Yes.    Have you been able to return to your normal activities? Yes.    Do you have any questions about your discharge instructions: Diet   No. Medications  No. Follow up visit  No.  Do you have questions or concerns about your Care? No.  Actions: * If pain score is 4 or above: No action needed, pain <4. Patient states discomfort this morning. Instructed to take medication and if symptoms worsens to call back.

## 2018-03-11 ENCOUNTER — Other Ambulatory Visit: Payer: Self-pay

## 2018-03-11 MED ORDER — OMEPRAZOLE 20 MG PO CPDR: 20.0000 mg | DELAYED_RELEASE_CAPSULE | Freq: Two times a day (BID) | ORAL | 0 refills | Status: DC

## 2018-03-11 MED ORDER — CLARITHROMYCIN 500 MG PO TABS: 500.0000 mg | ORAL_TABLET | Freq: Two times a day (BID) | ORAL | 0 refills | Status: DC

## 2018-03-11 MED ORDER — AMOXICILLIN 500 MG PO CAPS: 1000.0000 mg | ORAL_CAPSULE | Freq: Two times a day (BID) | ORAL | 0 refills | Status: DC

## 2018-03-15 ENCOUNTER — Ambulatory Visit: Payer: BLUE CROSS/BLUE SHIELD | Admitting: Family Medicine

## 2018-04-08 ENCOUNTER — Encounter: Payer: Self-pay | Admitting: Gastroenterology

## 2018-04-08 ENCOUNTER — Ambulatory Visit (INDEPENDENT_AMBULATORY_CARE_PROVIDER_SITE_OTHER): Payer: BLUE CROSS/BLUE SHIELD | Admitting: Gastroenterology

## 2018-04-08 VITALS — BP 106/72 | HR 88 | Ht 60.0 in | Wt 138.0 lb

## 2018-04-08 DIAGNOSIS — K3189 Other diseases of stomach and duodenum: Secondary | ICD-10-CM

## 2018-04-08 DIAGNOSIS — R1310 Dysphagia, unspecified: Secondary | ICD-10-CM | POA: Diagnosis not present

## 2018-04-08 DIAGNOSIS — Z8619 Personal history of other infectious and parasitic diseases: Secondary | ICD-10-CM | POA: Diagnosis not present

## 2018-04-08 DIAGNOSIS — K31A0 Gastric intestinal metaplasia, unspecified: Secondary | ICD-10-CM

## 2018-04-08 DIAGNOSIS — Z86010 Personal history of colonic polyps: Secondary | ICD-10-CM

## 2018-04-08 DIAGNOSIS — K219 Gastro-esophageal reflux disease without esophagitis: Secondary | ICD-10-CM | POA: Diagnosis not present

## 2018-04-08 MED ORDER — OMEPRAZOLE 40 MG PO CPDR: 40.0000 mg | DELAYED_RELEASE_CAPSULE | Freq: Two times a day (BID) | ORAL | 3 refills | Status: AC

## 2018-04-08 NOTE — Progress Notes (Signed)
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 Given her history of focal intestinal metaplasia, I recommend a follow-up EGD with biopsies to document irradication of H pylori and for gastric mapping given the incidence of gastric cancer in patient's from [place]. Unfortunately, she is no longer in network for [place] and will need to establish care with a new gastroenterologist to afford the procedure.   I recommended that she increase her omeprazole dosing frequency given her persistent cough and nocturnal symptoms.  She should continue to take famotidine 20 mg nightly.  Her symptoms persist she may want to consider pH probe studies on PPI and H2 blocker therapy.  We reviewed her colonoscopy pathology results revealing one tubular adenoma.  Surveillance colonoscopy recommended in 2027.  PLAN: Reviewed lifestyle modifications - encouraged her to raise the head of the bed not just use pillows (brochure provided) Increase omeprazole to 40 mg BID Continue famotidine 20 mg nightly Avoid all NSAIDs EGD given her intestinal metaplasia and to document resolution of H pylori Surveillance colonoscopy recommended 2027  HPI: Megan Ford is a 53 y.o. female returns in scheduled follow-up for GERD and dysphagia. From [place] but [place]. In the [place] since 1994. She is accompanied by a translator who provides interpretation services and her husband.  The interval history is obtained through the patient  and review of EPIC.  She recently had endoscopy.  EGD 02/26/2018: normal esophagus, mild H pylori gastritis Colonoscopy 02/26/2018: two polyps - one of which was a tubular adenoma She has been using omeprazole 40 mg daily and famotidine 20 mg nightly.  She completed 10 days of amoxicillin 1 g twice daily and clarithromycin 500 mg twice daily.  Feeling better except for a non-productive cough. Happens every time that she lies down and is therefore particularly works at night. Even using pillows to elevate her head, the pain does not improve.  No brash anymore. No cough when upright.  Otherwise, the reflux may be slightly better.  There is less nausea and vomiting.  Less of a sensation of food getting stuck in her chest.  Good appetite.  No change in weight.  She has no new complaints or concerns.   Past Medical History:  Diagnosis Date  . GERD (gastroesophageal reflux disease)   . Ovarian cyst Endometriois    Past Surgical History:  Procedure Laterality Date  . CARPAL TUNNEL RELEASE Left 06/10/2013   Procedure: LEFT CARPAL TUNNEL RELEASE;  Surgeon: Cammie Sickle., MD;  Location: [facility];  Service: Orthopedics;  Laterality: Left;  . LAPAROSCOPY N/A 01/09/2013   Procedure: LAPAROSCOPY DIAGNOSTIC;  Surgeon: Woodroe Mode, MD;  Location: [facility];  Service: Gynecology;  Laterality: N/A;  . lower abd. surgery(female surgery)      Current Outpatient Medications  Medication Sig Dispense Refill  . famotidine (PEPCID) 20 MG tablet Take 1 tablet (20 mg total) by mouth at bedtime. 30 tablet 3  . omeprazole (PRILOSEC) 40 MG capsule Take 1  capsule (40 mg total) by mouth 2 (two) times daily. 60 capsule 3   No current facility-administered medications for this visit.     Allergies as of 04/08/2018  . (No Known Allergies)    Family History  Problem Relation Age of Onset  . Asthma Father   . Colon cancer Neg Hx   . Colon polyps Neg Hx   . Esophageal cancer Neg Hx   . Rectal  cancer Neg Hx   . Stomach cancer Neg Hx     Social History   Socioeconomic History  . Marital status: Married    Spouse name: Not on file  . Number of children: 2  . Years of education: Not on file  . Highest education level: Not on file  Occupational History  . Not on file  Social Needs  . Financial resource strain: Not on file  . Food insecurity:    Worry: Not on file    Inability: Not on file  . Transportation needs:    Medical: Not on file    Non-medical: Not on file  Tobacco Use  . Smoking status: Never Smoker  . Smokeless tobacco: Never Used  Substance and Sexual Activity  . Alcohol use: No  . Drug use: No  . Sexual activity: Yes  Lifestyle  . Physical activity:    Days per week: Not on file    Minutes per session: Not on file  . Stress: Not on file  Relationships  . Social connections:    Talks on phone: Not on file    Gets together: Not on file    Attends religious service: Not on file    Active member of club or organization: Not on file    Attends meetings of clubs or organizations: Not on file    Relationship status: Not on file  . Intimate partner violence:    Fear of current or ex partner: Not on file    Emotionally abused: Not on file    Physically abused: Not on file    Forced sexual activity: Not on file  Other Topics Concern  . Not on file  Social History Narrative  . Not on file   [facility] Weights   04/08/18 1339  Weight: 138 lb (62.6 kg)    Physical Exam: Vital signs were reviewed. General:   Alert, well-nourished, pleasant and cooperative in NAD Head:  Normocephalic and atraumatic. Eyes:  Sclera clear, no icterus.   Conjunctiva pink. Mouth:  No deformity or lesions.   Neck:  Supple; no thyromegaly. Lungs:  Clear throughout to auscultation.   No wheezes.  Heart:  Regular rate and rhythm; no murmurs Abdomen:  Soft, nontender, normal bowel sounds. No rebound or guarding. No hepatosplenomegaly Neurologic:  Alert and  oriented x4;  grossly  nonfocal Skin:  No rash or bruise. Psych:  Alert and cooperative. Normal mood and affect.   Masaji Billups L. Tarri Glenn Md, MPH [facility] 04/09/2018, 1:10 PM

## 2018-04-08 NOTE — Patient Instructions (Signed)
Increase Omeprazole to 40 mg twice a day.   Continue Famotidine 20 mg daily.

## 2018-04-15 ENCOUNTER — Other Ambulatory Visit: Payer: Self-pay | Admitting: Family Medicine

## 2018-04-15 DIAGNOSIS — K31A0 Gastric intestinal metaplasia, unspecified: Secondary | ICD-10-CM

## 2018-04-15 DIAGNOSIS — K219 Gastro-esophageal reflux disease without esophagitis: Secondary | ICD-10-CM

## 2018-04-15 DIAGNOSIS — Z8619 Personal history of other infectious and parasitic diseases: Secondary | ICD-10-CM

## 2018-04-15 DIAGNOSIS — R1310 Dysphagia, unspecified: Secondary | ICD-10-CM

## 2018-04-15 DIAGNOSIS — K3189 Other diseases of stomach and duodenum: Secondary | ICD-10-CM

## 2018-04-15 DIAGNOSIS — Z86010 Personal history of colonic polyps: Secondary | ICD-10-CM

## 2018-04-16 ENCOUNTER — Telehealth: Payer: Self-pay | Admitting: Family Medicine

## 2018-04-16 NOTE — Telephone Encounter (Signed)
Patient gave me verbal permission to speak to her Husband Adv her that I sent her to another [facility] that was in her network [employer] thru [facility] 04/16/2018

## 2018-04-16 NOTE — Telephone Encounter (Signed)
-----   Message from Rutherford Guys, MD sent at 04/15/2018 10:11 PM EST ----- Regarding: referral placed Please let patient know that I made a referral to new GI as needed Please send this note with referral Interpreter needed Thanks ----- Message ----- From: Thornton Park, MD Sent: 04/09/2018   1:12 PM EST To: Rutherford Guys, MD

## 2018-05-20 DIAGNOSIS — K219 Gastro-esophageal reflux disease without esophagitis: Secondary | ICD-10-CM | POA: Diagnosis not present

## 2018-05-20 DIAGNOSIS — Z86010 Personal history of colonic polyps: Secondary | ICD-10-CM | POA: Diagnosis not present

## 2018-05-20 DIAGNOSIS — A048 Other specified bacterial intestinal infections: Secondary | ICD-10-CM | POA: Diagnosis not present

## 2018-05-20 DIAGNOSIS — K3189 Other diseases of stomach and duodenum: Secondary | ICD-10-CM | POA: Diagnosis not present

## 2019-09-13 ENCOUNTER — Ambulatory Visit: Payer: BLUE CROSS/BLUE SHIELD | Attending: Critical Care Medicine

## 2019-09-13 DIAGNOSIS — Z23 Encounter for immunization: Secondary | ICD-10-CM

## 2019-09-13 NOTE — Progress Notes (Signed)
° °  Covid-19 Vaccination Clinic  Name:  Megan Ford    MRN: [id number] DOB: 1965/07/24  09/13/2019  Megan Ford was observed post Covid-19 immunization for 15 minutes without incident. She was provided with Vaccine Information Sheet and instruction to access the V-Safe system.   Megan Ford was instructed to call 911 with any severe reactions post vaccine:  Difficulty breathing   Swelling of face and throat   A fast heartbeat   A bad rash all over body   Dizziness and weakness   Immunizations Administered    Name Date Dose VIS Date Route   Pfizer COVID-19 Vaccine 09/13/2019 10:54 AM 0.3 mL 04/16/2018 Intramuscular   Manufacturer: [place]   Lot: [id number]   [place]: [id number]

## 2019-10-04 ENCOUNTER — Ambulatory Visit: Payer: BLUE CROSS/BLUE SHIELD | Attending: Internal Medicine

## 2019-10-04 DIAGNOSIS — Z23 Encounter for immunization: Secondary | ICD-10-CM

## 2019-10-04 NOTE — Progress Notes (Signed)
   Covid-19 Vaccination Clinic  Name:  Megan Ford    MRN: [id number] DOB: Oct 20, 1965  10/04/2019  [name] was observed post Covid-19 immunization for 15 minutes without incident. She was provided with Vaccine Information Sheet and instruction to access the V-Safe system.   [name] was instructed to call 911 with any severe reactions post vaccine: Marland Kitchen Difficulty breathing  . Swelling of face and throat  . A fast heartbeat  . A bad rash all over body  . Dizziness and weakness   Immunizations Administered    Name Date Dose VIS Date Route   Pfizer COVID-19 Vaccine 10/04/2019 11:14 AM 0.3 mL 04/16/2018 Intramuscular   Manufacturer: [place]   Lot: [id number]   [place]: [address]-8
